# Patient Record
Sex: Female | Born: 1970
Health system: Southern US, Community
[De-identification: ages and names within clinical notes are randomized; demographics above are authoritative.]

## PROBLEM LIST (undated history)

## (undated) DIAGNOSIS — M797 Fibromyalgia: Secondary | ICD-10-CM

## (undated) DIAGNOSIS — C4491 Basal cell carcinoma of skin, unspecified: Secondary | ICD-10-CM

## (undated) DIAGNOSIS — E039 Hypothyroidism, unspecified: Secondary | ICD-10-CM

## (undated) HISTORY — DX: Hypothyroidism, unspecified: E03.9

## (undated) HISTORY — PX: BACK SURGERY: SHX140

## (undated) HISTORY — DX: Fibromyalgia: M79.7

---
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
## 1997-12-28 ENCOUNTER — Other Ambulatory Visit: Admission: RE | Admit: 1997-12-28 | Discharge: 1997-12-28 | Payer: Self-pay | Admitting: Obstetrics and Gynecology

## 2000-01-18 ENCOUNTER — Other Ambulatory Visit: Admission: RE | Admit: 2000-01-18 | Discharge: 2000-01-18 | Payer: Self-pay | Admitting: Obstetrics and Gynecology

## 2001-01-30 ENCOUNTER — Other Ambulatory Visit: Admission: RE | Admit: 2001-01-30 | Discharge: 2001-01-30 | Payer: Self-pay | Admitting: Obstetrics and Gynecology

## 2001-06-03 ENCOUNTER — Other Ambulatory Visit: Admission: RE | Admit: 2001-06-03 | Discharge: 2001-06-03 | Payer: Self-pay | Admitting: Obstetrics and Gynecology

## 2001-12-11 ENCOUNTER — Other Ambulatory Visit: Admission: RE | Admit: 2001-12-11 | Discharge: 2001-12-11 | Payer: Self-pay | Admitting: Obstetrics and Gynecology

## 2002-07-08 ENCOUNTER — Other Ambulatory Visit: Admission: RE | Admit: 2002-07-08 | Discharge: 2002-07-08 | Payer: Self-pay | Admitting: Obstetrics and Gynecology

## 2002-12-24 ENCOUNTER — Other Ambulatory Visit: Admission: RE | Admit: 2002-12-24 | Discharge: 2002-12-24 | Payer: Self-pay | Admitting: Obstetrics and Gynecology

## 2003-04-17 ENCOUNTER — Other Ambulatory Visit: Admission: RE | Admit: 2003-04-17 | Discharge: 2003-04-17 | Payer: Self-pay | Admitting: Obstetrics and Gynecology

## 2010-04-17 ENCOUNTER — Emergency Department (HOSPITAL_COMMUNITY)
Admission: EM | Admit: 2010-04-17 | Discharge: 2010-04-17 | Payer: Self-pay | Source: Home / Self Care | Admitting: Emergency Medicine

## 2010-06-29 ENCOUNTER — Encounter (HOSPITAL_COMMUNITY)
Admission: RE | Admit: 2010-06-29 | Discharge: 2010-06-29 | Disposition: A | Discharge status: Home / Self Care | Payer: BC Managed Care – PPO | Source: Ambulatory Visit | Attending: Obstetrics and Gynecology | Admitting: Obstetrics and Gynecology

## 2010-06-29 LAB — CBC
Hemoglobin: 13.6 g/dL (ref 12.0–15.0)
MCHC: 33.1 g/dL (ref 30.0–36.0)
MCV: 91.9 fL (ref 78.0–100.0)
RBC: 4.47 MIL/uL (ref 3.87–5.11)
RDW: 12.4 % (ref 11.5–15.5)
WBC: 5.3 10*3/uL (ref 4.0–10.5)

## 2010-06-29 LAB — SURGICAL PCR SCREEN
MRSA, PCR: NEGATIVE
Staphylococcus aureus: POSITIVE — AB

## 2010-07-01 ENCOUNTER — Ambulatory Visit (HOSPITAL_COMMUNITY)
Admission: RE | Admit: 2010-07-01 | Discharge: 2010-07-01 | Disposition: A | Discharge status: Home / Self Care | Payer: BC Managed Care – PPO | Source: Ambulatory Visit | Attending: Obstetrics and Gynecology | Admitting: Obstetrics and Gynecology

## 2010-07-01 ENCOUNTER — Other Ambulatory Visit: Payer: Self-pay | Admitting: Obstetrics and Gynecology

## 2010-07-01 DIAGNOSIS — N946 Dysmenorrhea, unspecified: Secondary | ICD-10-CM | POA: Insufficient documentation

## 2010-07-01 DIAGNOSIS — N83209 Unspecified ovarian cyst, unspecified side: Secondary | ICD-10-CM | POA: Insufficient documentation

## 2010-07-01 DIAGNOSIS — N803 Endometriosis of pelvic peritoneum, unspecified: Secondary | ICD-10-CM | POA: Insufficient documentation

## 2010-07-01 DIAGNOSIS — N7013 Chronic salpingitis and oophoritis: Secondary | ICD-10-CM | POA: Insufficient documentation

## 2010-07-01 DIAGNOSIS — IMO0002 Reserved for concepts with insufficient information to code with codable children: Secondary | ICD-10-CM | POA: Insufficient documentation

## 2010-07-01 DIAGNOSIS — R1031 Right lower quadrant pain: Secondary | ICD-10-CM | POA: Insufficient documentation

## 2010-07-01 DIAGNOSIS — Z01818 Encounter for other preprocedural examination: Secondary | ICD-10-CM | POA: Insufficient documentation

## 2010-07-02 NOTE — H&P (Signed)
  Samantha Hart, TAPLEY                 ACCOUNT NO.:  [id number]  MEDICAL RECORD NO.:  [id number]           PATIENT TYPE:  LOCATION:                                 FACILITY:  PHYSICIAN:  Lenoard Aden, M.D.DATE OF BIRTH:  March 30, 1971  DATE OF ADMISSION: DATE OF DISCHARGE:                             HISTORY & PHYSICAL   CHIEF COMPLAINT:  Left lower quadrant pain and dysmenorrhea.  HISTORY OF PRESENT ILLNESS:  She is a 40 year old white female G 0, P0 with a history of ongoing 3-4 months history of worsening pelvic pain and persistent complex left ovarian mass for surgical intervention.  She has signs and symptoms suggestive of pelvic endometriosis with dysmenorrhea and dyspareunia.  She has a most recent event CA-125 of 28.3.  She reports regular periods.  She denies intermenstrual bleeding. She proceeds now for surgical intervention, possible ablation of endometriosis, ovarian cystectomy and chromopertubation.  ALLERGIES:  She has allergies to SULFA.  She has no known latex allergies.  MEDICATIONS:  Synthroid, ibuprofen as needed, Allegra and a multivitamin.  PAST SURGICAL HISTORY:  She has a history of back surgery in 1995 and a tonsillectomy.  Her pregnancy history is noncontributory.  FAMILY HISTORY:  Noncontributory.  PAST MEDICAL HISTORY:  Medical problems include hypothyroidism.  PHYSICAL EXAMINATION:  GENERAL:  She is a well-developed, well-nourished white female in no acute distress.  Weight of 141 pounds, height of 5 feet 2-1/2 half. HEENT:  Normal. NECK:  Supple.  Full range of motion. LUNGS:  Clear. ABDOMEN:  Soft, scaphoid, and nontender. PELVIC:  Reveals uterus to be irregularly shaped, consistent with small fibroids and anteflexed, left adnexal tenderness and fullness appreciated.  Right adnexa was normal.  Rectal exam and cul-de-sac are negative. EXTREMITIES:  No cords. NEUROLOGICAL:  Nonfocal. SKIN:  Intact.  IMPRESSION: 1. Multiple asymptomatic  fibroids. 2. Dysmenorrhea, dyspareunia suggestive of endometriosis. 3. Complex left ovarian cyst suggestive of hemorrhagic cyst versus     endometrioma.  PLAN:  To proceed with da Vinci assisted robotic ablation of endometriosis, left ovarian cystectomy, chromopertubation.  The risks of anesthesia, infection, bleeding, injury to abdominal organs and need for repair was discussed, delayed versus immediate complications to include bowel and bladder injury noted, inability to cure pelvic pain, and/or cure endometriosis discussed.  The patient acknowledges and wishes to proceed.     Lenoard Aden, M.D.     RJT/MEDQ  D:  06/30/2010  T:  07/01/2010  Job:  [id number]  Electronically Signed by Olivia Mackie M.D. on 07/02/2010 11:19:46 AM

## 2010-07-02 NOTE — Op Note (Signed)
Samantha Hart, Samantha Hart                 ACCOUNT NO.:  [id number]  MEDICAL RECORD NO.:  [id number]           PATIENT TYPE:  O  LOCATION:  [facility]                          FACILITY:  [facility]  PHYSICIAN:  Lenoard Aden, M.D.DATE OF BIRTH:  1971/03/15  DATE OF PROCEDURE: DATE OF DISCHARGE:                              OPERATIVE REPORT   PREOPERATIVE DIAGNOSES: 1. Persistent painful left ovarian cyst. 2. Dysmenorrhea.  POSTOPERATIVE DIAGNOSES: 1. Persistent painful left ovarian cyst. 2. Dysmenorrhea. 3. Severe pelvic endometriosis. 4. Anterior and posterior cul-de-sac endometriosis. 5. Bowel adhesions to the left adnexa. 6. Left endometrioma. 7. Left hydrosalpinx with secondary tubal obstruction.  PROCEDURES:  Da Vinci-assisted laparoscopic lysis of adhesions, ablation of endometriosis, excision of endometriosis, left ovarian cystectomy, and chromopertubation.  SURGEON:  Lenoard Aden, MD  ASSISTANT:  Darryl Nestle, MD  ANESTHESIA:  General.  ESTIMATED BLOOD LOSS:  Less than 50 mL.  COMPLICATIONS:  None.  DRAINS:  Foley.  COUNTS:  Correct.  Patient to recovery in good condition.  SPECIMEN:  Left tube, left ovarian cyst wall, and peritoneal endometriosis excision specimen.  DESCRIPTION OF PROCEDURE:  After being apprised of the risks of anesthesia, infection, bleeding, injury to abdominal organs, need for repair, delayed versus immediate complications to include bowel and bladder injury with possible need for repair, the patient brought to the operating room where she was administered a general anesthetic without complications, prepped and draped in usual sterile fashion.  Foley catheter placed.  After achieving adequate anesthesia, dilute Marcaine solution placed in an infraumbilical area.  The cone cannula was placed per vagina and Foley catheter was placed.  Infraumbilical incision was made with scalpel.  Veress needle placed.  Four liters of CO2 insufflated  without difficulty after opening pressure of -2 was noted. 10-mm trocars was placed without difficulty.  Camera was placed. Visualization revealed atraumatic trocar entry.  Normal appendiceal area.  Normal liver and gallbladder area.  There are extensive adhesions to the left sigmoid colon into and obscuring the left adnexa.  There are multiple small subserosal fibroids on the fundal and posterior wall of the uterus.  There is a normal-appearing right tube.  There is a normal- appearing right ovary with evidence of ovarian endometriosis on the right pelvic and pelvic endometriosis in the anterior/posterior cul-de- sac noted.  At this time, accessory trocar sites are made on the left and 2 on the right, 1 assistant port and 1 robotic port.  The robot is then docked after establishing Trendelenburg position without difficulty.  The EndoShears and PK device are entered through the robotic ports under direct visualization and the robot had been docked as previously noted.  At this time, attention was turned to the robotic portion of the procedure where with appropriate retraction and visualization, it was apparent that the left sigmoid mesentery was adherent to the left ovary.  The left tube was also involved in this complex and no fimbria was noted, and there was no evidence of normal tube which was also dilated in its proximal and midportion.  At this point, the retroperitoneal space was entered at the level of  the left round ligament and dissection along the peritoneal wall was used to release the bowel adhesions; and on the lateral wall, these bowel adhesions in the mesentery were then excised sharply from the surface of the ovary avoiding the bowel lumen as noted.  At this time, the left ovarian cyst was opened and a large amount of chocolaty material consistent with left ovarian endometrioma was noted.  The cyst wall was excised in its entirety after identifying the ureter on the left,  and then the left ovary and ovarian adhesions into the left ovarian fossa are excised, freeing the left ovary in its entirety, cyst wall having being removed.  Tunneling under the left tube reveals no evidence of normalcy within that tube and a left hydrosalpinx.  Therefore, progressive bites were taken using the PK device along the left mesosalpinx and the left tube was excised down at the level of the left cornua.  At this time, good hemostasis noted.  The left ovary was now freed from its adhesions and the anterior cul-de-sac was approached whereby peritoneum and anterior cul-de-sac was excised sharply containing endometriosis.  Endometriosis on the right ovary was cauterized without difficulty and also peritoneal endometriosis excised from the posterior cul-de-sac.  At this time, chromopertubation was performed and dye extends freely through the right tube, which appears normal.  Interceed was placed along the entire left adnexa and the left ovarian fossa for adhesion prevention.  Irrigation was accomplished. Good hemostasis was noted.  No further endometriosis was visualized within the pelvis.  At this time, the robotic instruments were removed. The robot was then undocked.  Irrigation was further accomplished and good hemostasis was assured.  All trocar sites were then removed under direct visualization after CO2 was released.  Incisions were closed using 0 Vicryl, 4-0 Vicryl, and Dermabond.  Instruments were removed from the vagina.  Foley catheter was removed.  The patient tolerated the procedure well, was awakened and transferred to recovery room in good condition.     Lenoard Aden, M.D.     RJT/MEDQ  D:  07/01/2010  T:  07/01/2010  Job:  [id number]  Electronically Signed by Olivia Mackie M.D. on 07/02/2010 11:20:12 AM

## 2010-07-11 LAB — COMPREHENSIVE METABOLIC PANEL
AST: 22 U/L (ref 0–37)
Albumin: 4 g/dL (ref 3.5–5.2)
BUN: 13 mg/dL (ref 6–23)
Chloride: 102 mEq/L (ref 96–112)
GFR calc Af Amer: >60 mL/min (ref >60)
GFR calc non Af Amer: >60 mL/min (ref >60)
Total Bilirubin: 0.6 mg/dL (ref 0.3–1.2)

## 2010-07-11 LAB — CBC
HCT: 42.3 % (ref 36.0–46.0)
MCH: 31.3 pg (ref 26.0–34.0)
MCHC: 34.5 g/dL (ref 30.0–36.0)
MCV: 90.8 fL (ref 78.0–100.0)
RBC: 4.66 MIL/uL (ref 3.87–5.11)

## 2010-07-11 LAB — URINALYSIS, ROUTINE W REFLEX MICROSCOPIC
Glucose, UA: NEGATIVE mg/dL
Nitrite: NEGATIVE
Urobilinogen, UA: 0.2 mg/dL (ref 0.0–1.0)

## 2010-07-11 LAB — GC/CHLAMYDIA PROBE AMP, GENITAL: Chlamydia, DNA Probe: NEGATIVE

## 2010-07-11 LAB — WET PREP, GENITAL: Trich, Wet Prep: NONE SEEN

## 2010-07-11 LAB — DIFFERENTIAL
Eosinophils Absolute: 0.1 10*3/uL (ref 0.0–0.7)
Eosinophils Relative: 1 % (ref 0–5)
Lymphs Abs: 1.6 10*3/uL (ref 0.7–4.0)
Monocytes Absolute: 0.7 10*3/uL (ref 0.1–1.0)

## 2010-07-11 LAB — LIPASE, BLOOD: Lipase: 27 U/L (ref 11–59)

## 2011-06-22 ENCOUNTER — Other Ambulatory Visit: Payer: Self-pay | Admitting: Obstetrics and Gynecology

## 2011-06-22 DIAGNOSIS — Z1231 Encounter for screening mammogram for malignant neoplasm of breast: Secondary | ICD-10-CM

## 2011-07-03 ENCOUNTER — Ambulatory Visit
Admission: RE | Admit: 2011-07-03 | Discharge: 2011-07-03 | Disposition: A | Discharge status: Home / Self Care | Payer: BC Managed Care – PPO | Source: Ambulatory Visit | Attending: Obstetrics and Gynecology | Admitting: Obstetrics and Gynecology

## 2011-07-03 DIAGNOSIS — Z1231 Encounter for screening mammogram for malignant neoplasm of breast: Secondary | ICD-10-CM

## 2012-06-28 ENCOUNTER — Other Ambulatory Visit: Payer: Self-pay | Admitting: Obstetrics and Gynecology

## 2012-06-28 ENCOUNTER — Other Ambulatory Visit: Payer: Self-pay

## 2012-06-28 DIAGNOSIS — Z1231 Encounter for screening mammogram for malignant neoplasm of breast: Secondary | ICD-10-CM

## 2012-07-26 ENCOUNTER — Ambulatory Visit
Admission: RE | Admit: 2012-07-26 | Discharge: 2012-07-26 | Disposition: A | Discharge status: Home / Self Care | Payer: PRIVATE HEALTH INSURANCE | Source: Ambulatory Visit

## 2012-07-26 DIAGNOSIS — Z1231 Encounter for screening mammogram for malignant neoplasm of breast: Secondary | ICD-10-CM

## 2012-09-25 DIAGNOSIS — C4491 Basal cell carcinoma of skin, unspecified: Secondary | ICD-10-CM

## 2012-09-25 HISTORY — DX: Basal cell carcinoma of skin, unspecified: C44.91

## 2013-08-04 ENCOUNTER — Other Ambulatory Visit: Payer: Self-pay

## 2013-08-04 DIAGNOSIS — Z1231 Encounter for screening mammogram for malignant neoplasm of breast: Secondary | ICD-10-CM

## 2013-08-22 ENCOUNTER — Ambulatory Visit: Payer: PRIVATE HEALTH INSURANCE

## 2013-09-25 ENCOUNTER — Ambulatory Visit: Payer: PRIVATE HEALTH INSURANCE

## 2014-06-19 ENCOUNTER — Ambulatory Visit
Admission: RE | Admit: 2014-06-19 | Discharge: 2014-06-19 | Disposition: A | Discharge status: Home / Self Care | Payer: PRIVATE HEALTH INSURANCE | Source: Ambulatory Visit

## 2014-06-19 DIAGNOSIS — Z1231 Encounter for screening mammogram for malignant neoplasm of breast: Secondary | ICD-10-CM

## 2016-06-09 ENCOUNTER — Other Ambulatory Visit: Payer: Self-pay | Admitting: Obstetrics and Gynecology

## 2016-06-09 DIAGNOSIS — Z1231 Encounter for screening mammogram for malignant neoplasm of breast: Secondary | ICD-10-CM

## 2016-06-21 ENCOUNTER — Ambulatory Visit
Admission: RE | Admit: 2016-06-21 | Discharge: 2016-06-21 | Disposition: A | Discharge status: Home / Self Care | Payer: PRIVATE HEALTH INSURANCE | Source: Ambulatory Visit | Attending: Obstetrics and Gynecology | Admitting: Obstetrics and Gynecology

## 2016-06-21 DIAGNOSIS — Z1231 Encounter for screening mammogram for malignant neoplasm of breast: Secondary | ICD-10-CM

## 2017-06-28 ENCOUNTER — Other Ambulatory Visit: Payer: Self-pay | Admitting: Obstetrics and Gynecology

## 2017-06-28 DIAGNOSIS — Z1231 Encounter for screening mammogram for malignant neoplasm of breast: Secondary | ICD-10-CM

## 2017-07-02 ENCOUNTER — Ambulatory Visit
Admission: RE | Admit: 2017-07-02 | Discharge: 2017-07-02 | Disposition: A | Discharge status: Home / Self Care | Payer: PRIVATE HEALTH INSURANCE | Source: Ambulatory Visit | Attending: Obstetrics and Gynecology | Admitting: Obstetrics and Gynecology

## 2017-07-02 DIAGNOSIS — Z1231 Encounter for screening mammogram for malignant neoplasm of breast: Secondary | ICD-10-CM

## 2018-07-02 ENCOUNTER — Other Ambulatory Visit: Payer: Self-pay | Admitting: Internal Medicine

## 2018-07-02 DIAGNOSIS — Z1231 Encounter for screening mammogram for malignant neoplasm of breast: Secondary | ICD-10-CM

## 2018-07-05 ENCOUNTER — Ambulatory Visit: Payer: PRIVATE HEALTH INSURANCE

## 2018-08-07 ENCOUNTER — Ambulatory Visit: Payer: PRIVATE HEALTH INSURANCE

## 2018-09-12 ENCOUNTER — Other Ambulatory Visit: Payer: Self-pay

## 2018-09-12 ENCOUNTER — Ambulatory Visit
Admission: RE | Admit: 2018-09-12 | Discharge: 2018-09-12 | Disposition: A | Discharge status: Home / Self Care | Payer: Commercial Managed Care - PPO | Source: Ambulatory Visit | Attending: Internal Medicine | Admitting: Internal Medicine

## 2018-09-12 DIAGNOSIS — Z1231 Encounter for screening mammogram for malignant neoplasm of breast: Secondary | ICD-10-CM

## 2018-09-19 ENCOUNTER — Ambulatory Visit: Payer: PRIVATE HEALTH INSURANCE

## 2018-10-11 ENCOUNTER — Other Ambulatory Visit: Payer: Self-pay | Admitting: Rheumatology

## 2018-10-11 DIAGNOSIS — R0989 Other specified symptoms and signs involving the circulatory and respiratory systems: Secondary | ICD-10-CM

## 2018-10-21 ENCOUNTER — Encounter: Payer: Self-pay | Admitting: *Deleted

## 2018-10-24 ENCOUNTER — Ambulatory Visit
Admission: RE | Admit: 2018-10-24 | Discharge: 2018-10-24 | Disposition: A | Discharge status: Home / Self Care | Payer: Commercial Managed Care - PPO | Source: Ambulatory Visit | Attending: Rheumatology | Admitting: Rheumatology

## 2018-10-24 DIAGNOSIS — R0989 Other specified symptoms and signs involving the circulatory and respiratory systems: Secondary | ICD-10-CM

## 2018-11-04 ENCOUNTER — Other Ambulatory Visit: Payer: Self-pay

## 2018-11-04 ENCOUNTER — Ambulatory Visit (INDEPENDENT_AMBULATORY_CARE_PROVIDER_SITE_OTHER): Payer: Commercial Managed Care - PPO | Admitting: Pulmonary Disease

## 2018-11-04 ENCOUNTER — Encounter: Payer: Self-pay | Admitting: Pulmonary Disease

## 2018-11-04 VITALS — BP 112/70 | HR 96 | Temp 98.0°F | Ht 62.0 in | Wt 125.0 lb

## 2018-11-04 DIAGNOSIS — R9389 Abnormal findings on diagnostic imaging of other specified body structures: Secondary | ICD-10-CM

## 2018-11-04 NOTE — Progress Notes (Signed)
t  Subjective:     Patient ID: Samantha Hart, female   DOB: April 28, 1971, 48 y.o.   MRN: [id number]  She is being seen for abnormal CT scan of the chest  CT scan of the chest reveals groundglass changes This was done as a follow-up of her chest x-ray  Patient has no significant respiratory complaints at present Denies any chronic cough Denies any fevers or chills Denies any shortness of breath with activity  No previous history of lung disease known to her  Never smoker  No significant occupational exposure  No family history of lung disease    Review of Systems  Constitutional: Negative.   HENT: Positive for sinus pressure.   Eyes: Negative.   Respiratory: Negative.   Cardiovascular: Negative.   Gastrointestinal: Positive for constipation.  Endocrine: Positive for cold intolerance.  Genitourinary: Negative.   Musculoskeletal: Positive for arthralgias, joint swelling, neck pain and neck stiffness.  Skin: Negative.   Allergic/Immunologic: Positive for environmental allergies.  Neurological: Negative.   Hematological: Bruises/bleeds easily.  Psychiatric/Behavioral: Positive for dysphoric mood. The patient is nervous/anxious.    Past Medical History:  Diagnosis Date  . BCC (basal cell carcinoma) 09/25/2012   left lower lip- ([facility])  . Fibromyalgia   . Hypothyroidism    Family History  Problem Relation Age of Onset  . Lung cancer Father    Social History   Socioeconomic History  . Marital status: Married    Spouse name: Not on file  . Number of children: Not on file  . Years of education: Not on file  . Highest education level: Not on file  Occupational History  . Not on file  Social Needs  . Financial resource strain: Not on file  . Food insecurity    Worry: Not on file    Inability: Not on file  . Transportation needs    Medical: Not on file    Non-medical: Not on file  Tobacco Use  . Smoking status: Never Smoker  . Smokeless tobacco: Never Used   Substance and Sexual Activity  . Alcohol use: Not Currently  . Drug use: Not Currently  . Sexual activity: Not on file  Lifestyle  . Physical activity    Days per week: Not on file    Minutes per session: Not on file  . Stress: Not on file  Relationships  . Social Herbalist on phone: Not on file    Gets together: Not on file    Attends religious service: Not on file    Active member of club or organization: Not on file    Attends meetings of clubs or organizations: Not on file    Relationship status: Not on file  . Intimate partner violence    Fear of current or ex partner: Not on file    Emotionally abused: Not on file    Physically abused: Not on file    Forced sexual activity: Not on file  Other Topics Concern  . Not on file  Social History Narrative  . Not on file       Objective:   Physical Exam Constitutional:      General: She is not in acute distress.    Appearance: Normal appearance.  HENT:     Head: Normocephalic and atraumatic.  Eyes:     General:        Right eye: No discharge.        Left eye: No discharge.  Extraocular Movements: Extraocular movements intact.     Pupils: Pupils are equal, round, and reactive to light.  Neck:     Musculoskeletal: Normal range of motion. No neck rigidity or muscular tenderness.  Cardiovascular:     Rate and Rhythm: Normal rate and regular rhythm.     Pulses: Normal pulses.     Heart sounds: No murmur.  Pulmonary:     Effort: Pulmonary effort is normal. No respiratory distress.     Breath sounds: Normal breath sounds. No stridor. No wheezing, rhonchi or rales.  Chest:     Chest wall: No tenderness.  Abdominal:     General: Abdomen is flat. There is no distension.     Palpations: There is no mass.  Musculoskeletal: Normal range of motion.        General: No swelling or tenderness.  Skin:    General: Skin is warm and dry.     Coloration: Skin is not jaundiced or pale.  Neurological:     General:  No focal deficit present.     Mental Status: She is alert.  Psychiatric:        Mood and Affect: Mood normal.    Vitals:   11/04/18 1136  BP: 112/70  Pulse: 96  Temp: 98 F (36.7 C)  SpO2: 98%    High-resolution CT scan of the chest reviewed with the patient Areas of groundglass changes noted mostly on the right base No evidence of scarring     Assessment:     Abnormal CT scan of the chest showing groundglass changes -She is completely asymptomatic at the present time with no cough, no shortness of breath, -Recent treatment for URI-completing course of Augmentin -No previous history of lung disease -Never smoker Significance of changes is unclear at the present time May be related to connective tissue disease however MCTD is not a usual cause of groundglass changes on current CT does not reveal any significant evidence of scarring  Mixed connective tissue disease  Fibromyalgia    Plan:     Will obtain a pulmonary function study  The PFT and high-resolution CT can be seen as baseline studies  Patient worked to develop any significant symptoms of shortness of breath, cough, respiratory complaints Studies may be repeated to try and explain symptoms  No contraindication to use of methotrexate Repeat PFTs/high-resolution CTs without symptoms have not been shown to help in monitoring for development of symptoms  I will see her back in the office in about 3 months Encouraged to call with any significant concerns

## 2018-11-04 NOTE — Patient Instructions (Signed)
Abnormal CT scan of the chest with groundglass changes CT is not suggestive of interstitial lung disease  Patient currently is completely asymptomatic Recent URI  We will obtain pulmonary function test  The PFT and high-resolution CT can be seen as baseline studies If she were to develop any symptoms they can always be repeated and compared to further explain symptoms No contraindication to using methotrexate   I will see you back in the office in 3 months Call with any concerns

## 2019-02-01 ENCOUNTER — Other Ambulatory Visit (HOSPITAL_COMMUNITY)
Admission: RE | Admit: 2019-02-01 | Discharge: 2019-02-01 | Disposition: A | Discharge status: Home / Self Care | Payer: Commercial Managed Care - PPO | Source: Ambulatory Visit | Attending: Pulmonary Disease | Admitting: Pulmonary Disease

## 2019-02-01 DIAGNOSIS — Z01812 Encounter for preprocedural laboratory examination: Secondary | ICD-10-CM | POA: Insufficient documentation

## 2019-02-01 DIAGNOSIS — Z20828 Contact with and (suspected) exposure to other viral communicable diseases: Secondary | ICD-10-CM | POA: Diagnosis not present

## 2019-02-03 ENCOUNTER — Ambulatory Visit: Payer: Commercial Managed Care - PPO | Admitting: Pulmonary Disease

## 2019-02-03 LAB — NOVEL CORONAVIRUS, NAA (HOSP ORDER, SEND-OUT TO REF LAB; TAT 18-24 HRS): SARS-CoV-2, NAA: NOT DETECTED

## 2019-02-05 ENCOUNTER — Ambulatory Visit (INDEPENDENT_AMBULATORY_CARE_PROVIDER_SITE_OTHER): Payer: Commercial Managed Care - PPO | Admitting: Pulmonary Disease

## 2019-02-05 ENCOUNTER — Encounter: Payer: Self-pay | Admitting: Pulmonary Disease

## 2019-02-05 ENCOUNTER — Other Ambulatory Visit: Payer: Self-pay

## 2019-02-05 VITALS — BP 114/62 | HR 88 | Ht 63.0 in | Wt 126.0 lb

## 2019-02-05 DIAGNOSIS — R9389 Abnormal findings on diagnostic imaging of other specified body structures: Secondary | ICD-10-CM

## 2019-02-05 LAB — PULMONARY FUNCTION TEST
DL/VA % pred: 124 %
DL/VA: 5.44 ml/min/mmHg/L
DLCO unc % pred: 107 %
DLCO unc: 22.02 ml/min/mmHg
FEF 25-75 Post: 2.37 L/sec
FEF 25-75 Pre: 1.94 L/sec
FEF2575-%Change-Post: 22 %
FEF2575-%Pred-Post: 85 %
FEF2575-%Pred-Pre: 69 %
FEV1-%Change-Post: 11 %
FEV1-%Pred-Post: 83 %
FEV1-%Pred-Pre: 75 %
FEV1-Post: 2.32 L
FEV1-Pre: 2.08 L
FEV1FVC-%Change-Post: 5 %
FEV1FVC-%Pred-Pre: 98 %
FEV6-%Change-Post: 6 %
FEV6-%Pred-Post: 81 %
FEV6-%Pred-Pre: 76 %
FEV6-Post: 2.75 L
FEV6-Pre: 2.58 L
FEV6FVC-%Change-Post: 0 %
FEV6FVC-%Pred-Post: 102 %
FEV6FVC-%Pred-Pre: 102 %
FVC-%Change-Post: 5 %
FVC-%Pred-Post: 80 %
FVC-%Pred-Pre: 75 %
FVC-Post: 2.77 L
FVC-Pre: 2.61 L
Post FEV1/FVC ratio: 84 %
Post FEV6/FVC ratio: 100 %
Pre FEV1/FVC ratio: 80 %
Pre FEV6/FVC Ratio: 100 %
RV % pred: 100 %
RV: 1.71 L
TLC % pred: 87 %
TLC: 4.28 L

## 2019-02-05 MED ORDER — BREO ELLIPTA 100-25 MCG/INH IN AEPB: 1.0000 | INHALATION_SPRAY | Freq: Every day | RESPIRATORY_TRACT | 0 refills | Status: AC

## 2019-02-05 MED ORDER — BREO ELLIPTA 100-25 MCG/INH IN AEPB: 1.0000 | INHALATION_SPRAY | Freq: Every day | RESPIRATORY_TRACT | 5 refills | Status: AC

## 2019-02-05 MED ORDER — ALBUTEROL SULFATE HFA 108 (90 BASE) MCG/ACT IN AERS: 2.0000 | INHALATION_SPRAY | Freq: Four times a day (QID) | RESPIRATORY_TRACT | 6 refills | Status: AC | PRN

## 2019-02-05 NOTE — Progress Notes (Signed)
Subjective:     Patient ID: Samantha Hart, female   DOB: October 16, 1970, 48 y.o.   MRN: [id number]  She is being seen for abnormal CT scan of the chest No significant changes in symptoms since being here She does get some shortness of breath with activity  CT scan of the chest reveals groundglass changes This was done as a follow-up of her chest x-ray She was being treated for respiratory infection/sinus infection with Augmentin  No significant symptoms suggesting respiratory infection at present No chronic cough, no fevers or chills Some shortness of breath with significant activity  No previous history of lung disease known to her  Never smoker  No significant occupational exposure  No family history of lung disease    Review of Systems  Constitutional: Negative.   Eyes: Negative.   Respiratory: Negative.   Cardiovascular: Negative.   Gastrointestinal: Positive for constipation.  Endocrine: Positive for cold intolerance.  Genitourinary: Negative.   Musculoskeletal: Negative.   Skin: Negative.   Allergic/Immunologic: Positive for environmental allergies.  Neurological: Negative.   Hematological: Bruises/bleeds easily.  Psychiatric/Behavioral: Positive for dysphoric mood.  All other systems reviewed and are negative.  Past Medical History:  Diagnosis Date  . BCC (basal cell carcinoma) 09/25/2012   left lower lip- ([facility])  . Fibromyalgia   . Hypothyroidism    Family History  Problem Relation Age of Onset  . Lung cancer Father    Social History   Socioeconomic History  . Marital status: Married    Spouse name: Not on file  . Number of children: Not on file  . Years of education: Not on file  . Highest education level: Not on file  Occupational History  . Not on file  Social Needs  . Financial resource strain: Not on file  . Food insecurity    Worry: Not on file    Inability: Not on file  . Transportation needs    Medical: Not on file    Non-medical: Not  on file  Tobacco Use  . Smoking status: Never Smoker  . Smokeless tobacco: Never Used  Substance and Sexual Activity  . Alcohol use: Not Currently  . Drug use: Not Currently  . Sexual activity: Not on file  Lifestyle  . Physical activity    Days per week: Not on file    Minutes per session: Not on file  . Stress: Not on file  Relationships  . Social Herbalist on phone: Not on file    Gets together: Not on file    Attends religious service: Not on file    Active member of club or organization: Not on file    Attends meetings of clubs or organizations: Not on file    Relationship status: Not on file  . Intimate partner violence    Fear of current or ex partner: Not on file    Emotionally abused: Not on file    Physically abused: Not on file    Forced sexual activity: Not on file  Other Topics Concern  . Not on file  Social History Narrative  . Not on file       Objective:   Physical Exam Constitutional:      General: She is not in acute distress.    Appearance: Normal appearance.  HENT:     Head: Normocephalic and atraumatic.     Nose: No congestion.  Eyes:     General:        Right  eye: No discharge.        Left eye: No discharge.     Extraocular Movements: Extraocular movements intact.     Pupils: Pupils are equal, round, and reactive to light.  Neck:     Musculoskeletal: Normal range of motion. No neck rigidity or muscular tenderness.  Cardiovascular:     Rate and Rhythm: Normal rate and regular rhythm.     Pulses: Normal pulses.     Heart sounds: No murmur.  Pulmonary:     Effort: Pulmonary effort is normal. No respiratory distress.     Breath sounds: Normal breath sounds. No stridor. No wheezing, rhonchi or rales.     Comments: Pectus excavatum Chest:     Chest wall: No tenderness.  Abdominal:     General: Abdomen is flat. There is no distension.     Palpations: There is no mass.  Skin:    General: Skin is dry.  Neurological:     Mental  Status: She is alert.  Psychiatric:        Mood and Affect: Mood normal.    Vitals:   02/05/19 1008  BP: 114/62  Pulse: 88  SpO2: 98%    High-resolution CT scan of the chest reviewed with the patient-review today Areas of groundglass changes noted mostly on the right base No evidence of scarring  PFT shows no obstruction, evidence of small airway obstructive obstruction with bronchodilator response of 22% No restriction Normal diffusing capacity    Assessment:     Abnormal CT scan of the chest showing groundglass changes -This may have been related to recent URI -It needs followed up to resolution -She has no previous underlying history of lung disease -Never smoker -No occupational predisposition -PFT shows no restrictive physiology  Shortness of breath with significant exertion -There may be a bronchospastic component component/small airway disease -Trial with an inhaler may be of benefit -This was discussed extensively how to use inhalers  Methotrexate may cause interstitial lung disease -We will follow closely  Fibromyalgia     Plan:     Will start Breo 100 to be used once a day  Albuterol to be used as needed  Repeat CT scan of the chest without contrast for follow-up of infiltrative process to resolution  No contraindication to use of methotrexate  We will repeat CT scan of the chest  I will see her back in the office in about 3 months  Encouraged to call with any significant concerns

## 2019-02-05 NOTE — Progress Notes (Signed)
Full PFT performed today. °

## 2019-02-05 NOTE — Patient Instructions (Signed)
Abnormal CT scan showing groundglass changes Breathing study today is normal with some improvement in lung function with use of albuterol  We will prescribe albuterol to be used as needed up to 4 times a day Breo-to be used once a day regardless of how you are feeling We will repeat your CT scan to compare with previous-expectation is for the haziness to have cleared completely by now  I will see you back in the office in 3 months Call with significant concerns

## 2019-05-08 ENCOUNTER — Inpatient Hospital Stay: Admission: RE | Admit: 2019-05-08 | Payer: Commercial Managed Care - PPO | Source: Ambulatory Visit

## 2019-05-09 ENCOUNTER — Ambulatory Visit (INDEPENDENT_AMBULATORY_CARE_PROVIDER_SITE_OTHER)
Admission: RE | Admit: 2019-05-09 | Discharge: 2019-05-09 | Disposition: A | Discharge status: Home / Self Care | Payer: Commercial Managed Care - PPO | Source: Ambulatory Visit | Attending: Pulmonary Disease | Admitting: Pulmonary Disease

## 2019-05-09 ENCOUNTER — Other Ambulatory Visit: Payer: Self-pay

## 2019-05-09 DIAGNOSIS — R9389 Abnormal findings on diagnostic imaging of other specified body structures: Secondary | ICD-10-CM

## 2019-05-22 ENCOUNTER — Ambulatory Visit (INDEPENDENT_AMBULATORY_CARE_PROVIDER_SITE_OTHER): Payer: Commercial Managed Care - PPO | Admitting: Pulmonary Disease

## 2019-05-22 ENCOUNTER — Encounter: Payer: Self-pay | Admitting: Pulmonary Disease

## 2019-05-22 ENCOUNTER — Other Ambulatory Visit: Payer: Self-pay

## 2019-05-22 VITALS — BP 116/68 | HR 64 | Temp 98.4°F | Ht 62.0 in | Wt 129.8 lb

## 2019-05-22 DIAGNOSIS — R9389 Abnormal findings on diagnostic imaging of other specified body structures: Secondary | ICD-10-CM

## 2019-05-22 NOTE — Patient Instructions (Signed)
Your CAT scan cleared up completely as we reviewed  You may continue to use albuterol as needed  We will be glad to see you if any issues arise  Otherwise continue to follow-up with your primary doctor

## 2019-05-22 NOTE — Progress Notes (Signed)
Subjective:     Patient ID: Samantha Hart, female   DOB: [birth year], 49 y.o.   MRN: [id number]  She is in for follow-up today  She is feeling well Symptoms have significantly improved Occasionally still feels she needs to use albuterol whenever she is working out Denies a cough No shortness of breath with usual activity  Initially seen for abnormal CT with bibasal groundglass infiltrates She was treated with Augmentin at the time  No significant symptoms suggesting respiratory infection at present No chronic cough, no fevers or chills Some shortness of breath with significant activity  No previous history of lung disease known to her  Never smoker  No significant occupational exposure  No family history of lung disease    Review of Systems  Constitutional: Negative.   Eyes: Negative.   Respiratory: Negative.   Cardiovascular: Negative.   Gastrointestinal: Positive for constipation.  Endocrine: Positive for cold intolerance.  Genitourinary: Negative.   Musculoskeletal: Negative.   Skin: Negative.   Allergic/Immunologic: Positive for environmental allergies.  Neurological: Negative.   All other systems reviewed and are negative.  Past Medical History:  Diagnosis Date  . BCC (basal cell carcinoma) 09/25/2012   left lower lip- (MOHS)  . Fibromyalgia   . Hypothyroidism    Family History  Problem Relation Age of Onset  . Lung cancer Father    Social History   Socioeconomic History  . Marital status: Married    Spouse name: Not on file  . Number of children: Not on file  . Years of education: Not on file  . Highest education level: Not on file  Occupational History  . Not on file  Tobacco Use  . Smoking status: Never Smoker  . Smokeless tobacco: Never Used  Substance and Sexual Activity  . Alcohol use: Not Currently  . Drug use: Not Currently  . Sexual activity: Not on file  Other Topics Concern  . Not on file  Social History Narrative  . Not on file    Social Determinants of Health   Financial Resource Strain:   . Difficulty of Paying Living Expenses: Not on file  Food Insecurity:   . Worried About Charity fundraiser in the Last Year: Not on file  . Ran Out of Food in the Last Year: Not on file  Transportation Needs:   . Lack of Transportation (Medical): Not on file  . Lack of Transportation (Non-Medical): Not on file  Physical Activity:   . Days of Exercise per Week: Not on file  . Minutes of Exercise per Session: Not on file  Stress:   . Feeling of Stress : Not on file  Social Connections:   . Frequency of Communication with Friends and Family: Not on file  . Frequency of Social Gatherings with Friends and Family: Not on file  . Attends Religious Services: Not on file  . Active Member of Clubs or Organizations: Not on file  . Attends Archivist Meetings: Not on file  . Marital Status: Not on file  Intimate Partner Violence:   . Fear of Current or Ex-Partner: Not on file  . Emotionally Abused: Not on file  . Physically Abused: Not on file  . Sexually Abused: Not on file       Objective:   Physical Exam Constitutional:      General: She is not in acute distress.    Appearance: Normal appearance.  HENT:     Head: Normocephalic and atraumatic.  Nose: No congestion.  Eyes:     General:        Right eye: No discharge.        Left eye: No discharge.     Extraocular Movements: Extraocular movements intact.     Pupils: Pupils are equal, round, and reactive to light.  Cardiovascular:     Rate and Rhythm: Normal rate and regular rhythm.     Pulses: Normal pulses.     Heart sounds: No murmur.  Pulmonary:     Effort: Pulmonary effort is normal. No respiratory distress.     Breath sounds: Normal breath sounds. No stridor. No wheezing, rhonchi or rales.     Comments: Pectus excavatum Chest:     Chest wall: No tenderness.  Musculoskeletal:     Cervical back: Normal range of motion. No rigidity. No muscular  tenderness.  Neurological:     Mental Status: She is alert.    Vitals:   05/22/19 1145  BP: 116/68  Pulse: 64  Temp: 98.4 F (36.9 C)  SpO2: 97%    High-resolution CT scan of the chest reviewed with the patient-review today -CT reviewed showing resolution of previous infiltrative process  PFT with within normal limits, slight improvement with bronchodilators in the small airways    Assessment:     Abnormal CT scan of the chest showing groundglass changes -CT is completely clear at present -No significant symptoms at rest -No cough -Occupational predisposition  Chest tightness, shortness of breath with activity -She may continue to use albuterol as needed  Methotrexate may cause interstitial lung disease -We will follow closely  Fibromyalgia     Plan:     Will discontinue Breo  Albuterol use as needed  No need for repeat CT or PFT  She may follow-up with primary doctor  We will see as needed

## 2019-08-05 ENCOUNTER — Other Ambulatory Visit: Payer: Self-pay | Admitting: Internal Medicine

## 2019-08-05 DIAGNOSIS — Z1231 Encounter for screening mammogram for malignant neoplasm of breast: Secondary | ICD-10-CM

## 2019-09-15 ENCOUNTER — Ambulatory Visit: Payer: Commercial Managed Care - PPO

## 2020-09-29 ENCOUNTER — Other Ambulatory Visit: Payer: Self-pay | Admitting: Internal Medicine

## 2020-09-29 DIAGNOSIS — Z1231 Encounter for screening mammogram for malignant neoplasm of breast: Secondary | ICD-10-CM

## 2021-03-13 LAB — EXTERNAL GENERIC LAB PROCEDURE: COLOGUARD: NEGATIVE

## 2021-03-13 LAB — COLOGUARD: COLOGUARD: NEGATIVE

## 2021-07-29 IMAGING — CT CT CHEST W/O CM
2 of 3 series · 15 of 36 positions shown, 18 images · non-contrast
Comparison: High-resolution chest CT 10/24/2018.

CLINICAL DATA: 48-year-old female with mild bilateral lower lobe
scattered ground-glass pulmonary opacity on high-resolution chest CT
September 2018.

EXAM:
CT CHEST WITHOUT CONTRAST
TECHNIQUE: Multidetector CT imaging of the chest was performed following the
standard protocol without IV contrast.

[Series 2: thorax · axial · 0.63mm/px · z∈[-202,+42]mm · 12 of 144 slices shown, 15 images]
[im 11/144  mediastinal]
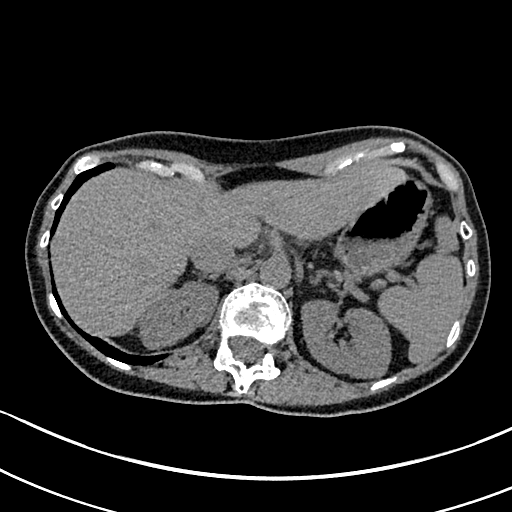
[im 11/144  lung]
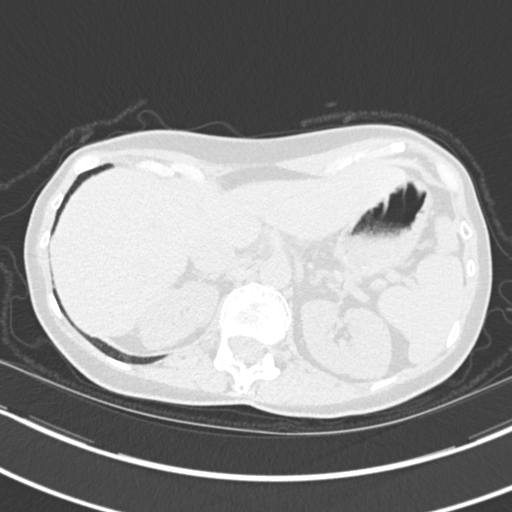
[im 22/144  lung]
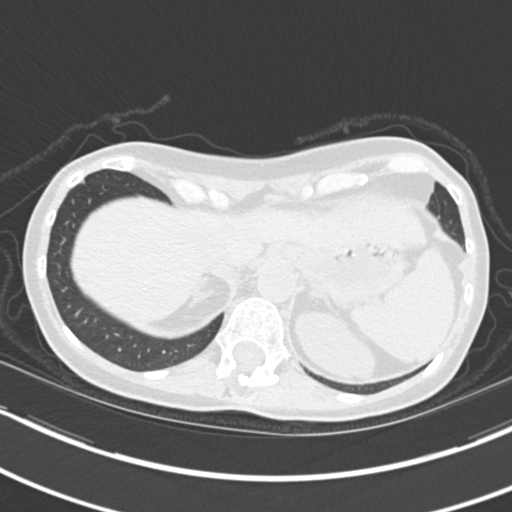
[im 32/144  lung]
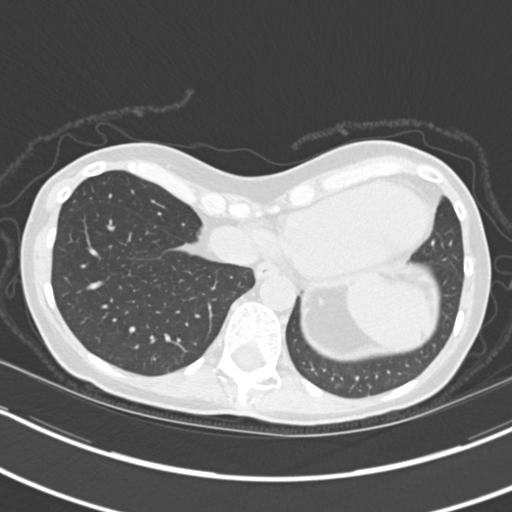
[im 43/144  lung]
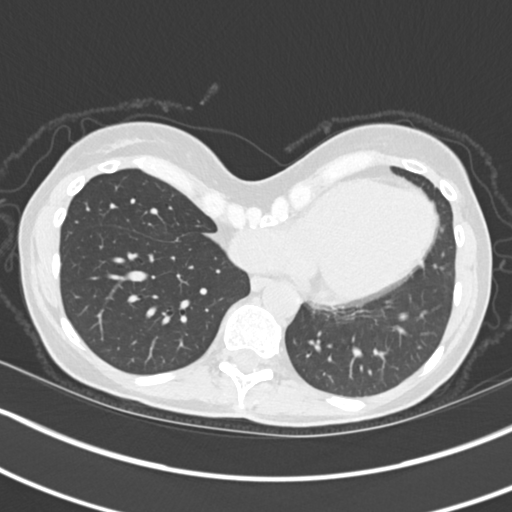
[im 53/144  mediastinal]
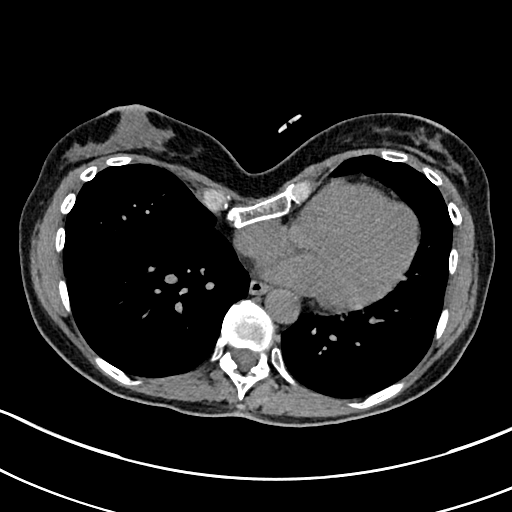
[im 53/144  lung]
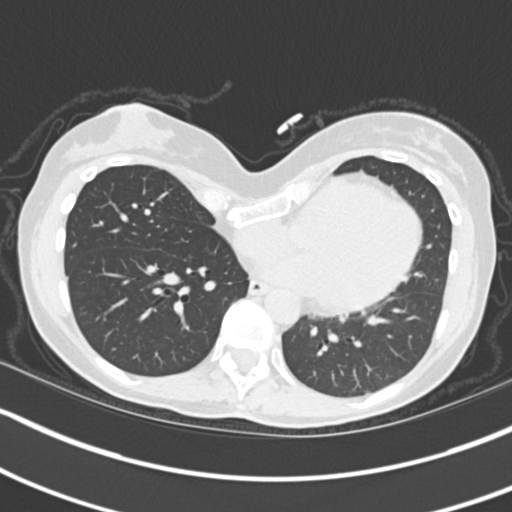
[im 64/144  lung]
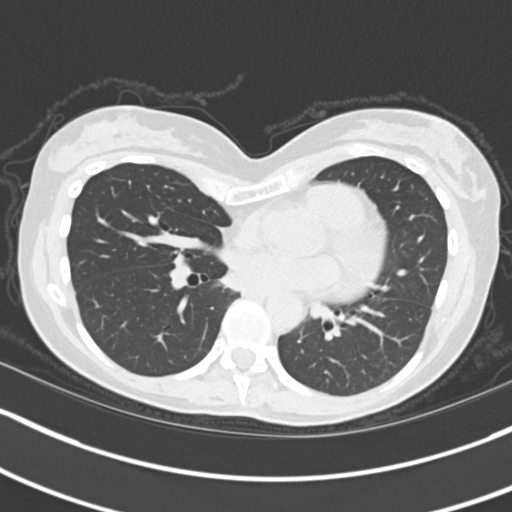
[im 80/144  lung]
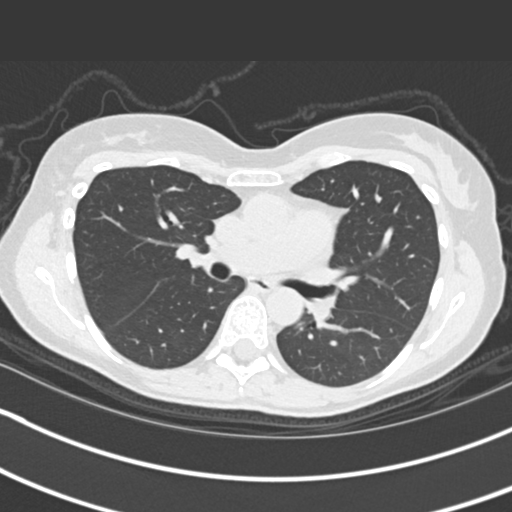
[im 91/144  lung]
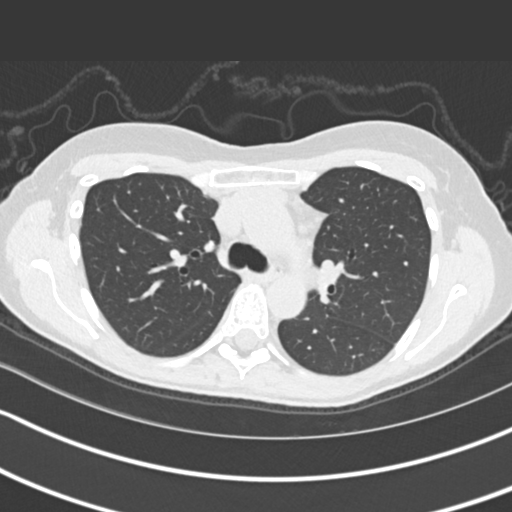
[im 101/144  mediastinal]
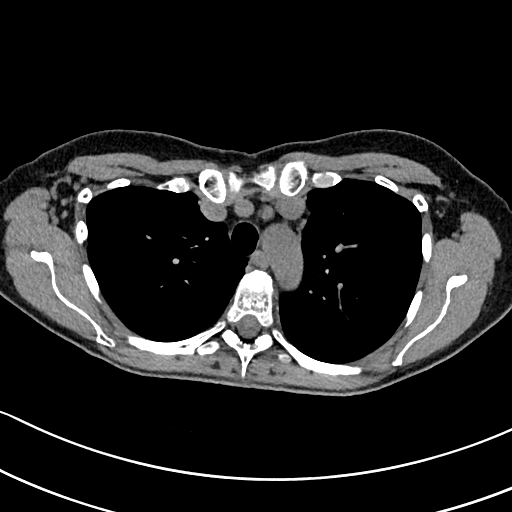
[im 101/144  lung]
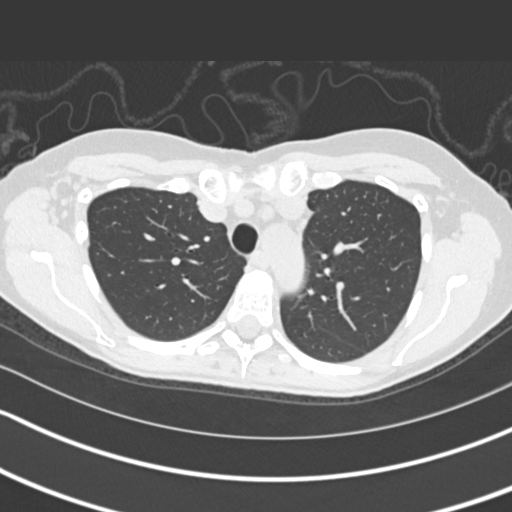
[im 112/144  lung]
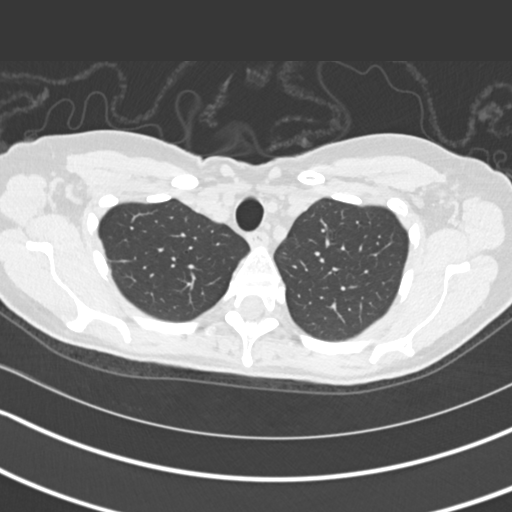
[im 122/144  lung]
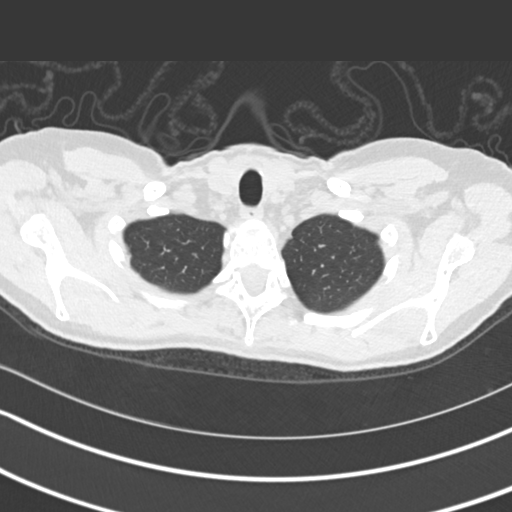
[im 133/144  lung]
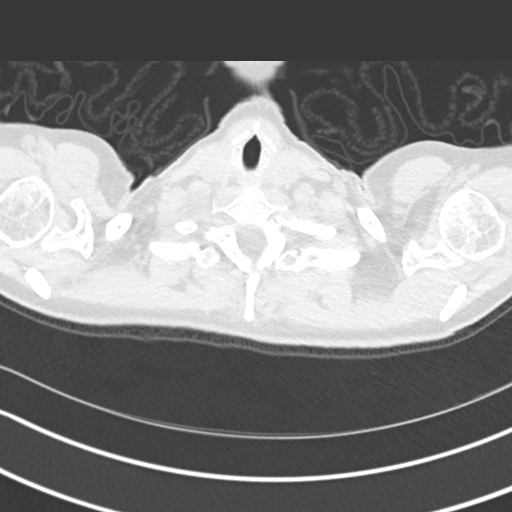

[Series 5: coronal · coronal · 0.59mm/px · 3 of 99 slices shown]
[im 20/99  lung]
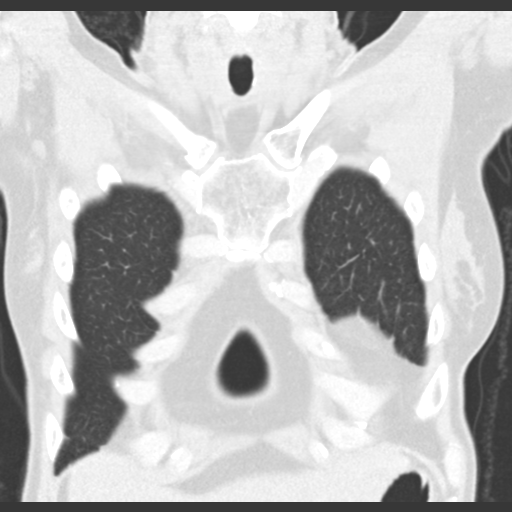
[im 40/99  lung]
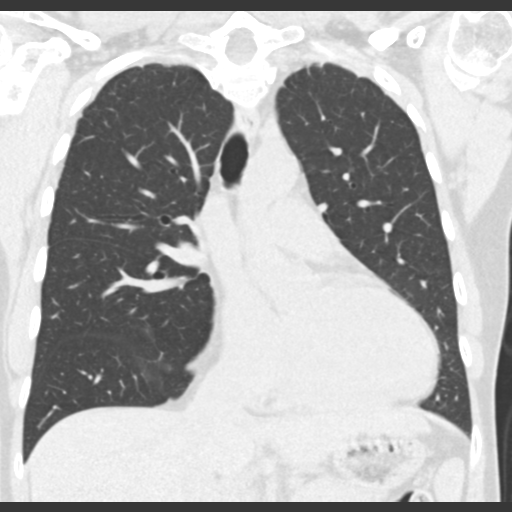
[im 59/99  lung]
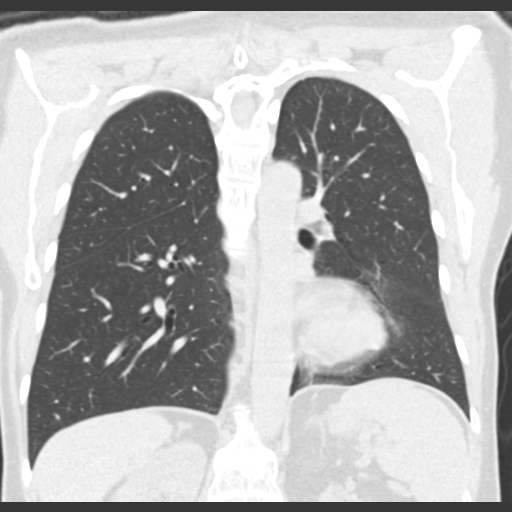

[15 of 36 positions shown; findings below may reference images not displayed]

FINDINGS: Cardiovascular: No cardiomegaly or pericardial effusion. No
calcified coronary artery atherosclerosis is evident. Vascular
patency is not evaluated in the absence of IV contrast.

Mediastinum/Nodes: Negative.  No lymphadenopathy.

Lungs/Pleura: Stable lung volumes.  Major airways are patent.

The bilateral central and superior segment lower lobe faint
peribronchial ground-glass opacity seen in [REDACTED] has fully resolved.
There is minimal anterior basal segment left lower lobe scarring or
atelectasis. Both lungs are otherwise clear.

Upper Abdomen: Negative visible noncontrast liver, spleen, pancreas,
adrenal glands, kidneys and bowel in the upper abdomen.

Musculoskeletal: Pectus excavatum and mild dextroconvex thoracic
scoliosis. Posterior element ankylosis at the thoracolumbar
junction.

Superimposed mild chronic vertebral compression fractures including
T1 through T5 and L1. Several superimposed thoracic superior
endplate Schmorl's nodes. No acute osseous abnormality identified.
IMPRESSION: 1. Negative for pulmonary nodule, and the faint areas of bilateral
lower lobe ground-glass opacity in September 2018 have resolved.
2. No acute findings in the chest.
3. Pectus excavatum. Chronic vertebral compression fractures. Mild
scoliosis and thoracolumbar junction posterior element ankylosis.

## 2021-10-08 ENCOUNTER — Emergency Department (HOSPITAL_COMMUNITY)
Admission: EM | Admit: 2021-10-08 | Discharge: 2021-10-09 | Disposition: A | Discharge status: Home / Self Care | Payer: Commercial Managed Care - PPO | Attending: Emergency Medicine | Admitting: Emergency Medicine

## 2021-10-08 ENCOUNTER — Encounter (HOSPITAL_COMMUNITY): Payer: Self-pay

## 2021-10-08 ENCOUNTER — Other Ambulatory Visit: Payer: Self-pay

## 2021-10-08 ENCOUNTER — Emergency Department (HOSPITAL_COMMUNITY): Payer: Commercial Managed Care - PPO

## 2021-10-08 DIAGNOSIS — M545 Low back pain, unspecified: Secondary | ICD-10-CM | POA: Diagnosis not present

## 2021-10-08 DIAGNOSIS — E039 Hypothyroidism, unspecified: Secondary | ICD-10-CM | POA: Diagnosis not present

## 2021-10-08 MED ORDER — FENTANYL CITRATE PF 50 MCG/ML IJ SOSY
50.0000 ug | PREFILLED_SYRINGE | Freq: Once | INTRAMUSCULAR | Status: AC
  Administered 2021-10-08: 50 ug via INTRAVENOUS
  Filled 2021-10-08: qty 1

## 2021-10-08 MED ORDER — ONDANSETRON HCL 4 MG/2ML IJ SOLN
4.0000 mg | Freq: Once | INTRAMUSCULAR | Status: AC | PRN
  Administered 2021-10-08: 4 mg via INTRAVENOUS
  Filled 2021-10-08: qty 2

## 2021-10-08 NOTE — ED Notes (Signed)
Patient had one episode of emesis. Triage RN notified.

## 2021-10-08 NOTE — ED Provider Triage Note (Signed)
Emergency Medicine Provider Triage Evaluation Note  Samantha DROMGOOLE , a 51 y.o. female  was evaluated in triage.  Pt complains of back pain status post fall while standing on her yard.  Endorsing midline tenderness exacerbated along the right side especially with raising the right leg.  Given 50 mics of fentanyl by EMS.  Prior history of back surgery by Dr Ellene Route.  No numbness or tingling to bilateral legs.  Review of Systems  Positive: Back pain Negative: Minnis, incontinence,fever   Physical Exam  BP 113/71 (BP Location: Right Arm)   Pulse 83   Temp 98.6 F (37 C) (Oral)   Resp 16   LMP 06/27/2011   SpO2 100%  Gen:   Awake, no distress   Resp:  Normal effort  MSK:   Moves extremities without difficulty  Other:  To palpation along the midline of the lumbar spine, radiating onto the right buttocks.  Decreased strength to bilateral extremities due to pain.   Medical Decision Making  Medically screening exam initiated at 7:25 PM.  Appropriate orders placed.  VARIE MACHAMER was informed that the remainder of the evaluation will be completed by another provider, this initial triage assessment does not replace that evaluation, and the importance of remaining in the ED until their evaluation is complete.  With prior history of pathology, felt appropriate to obtain baseline CTs.  Given 50 mics of fentanyl due to discomfort.   Janeece Fitting, PA-C 10/08/21 1929

## 2021-10-08 NOTE — ED Triage Notes (Signed)
Pt arrives EMS after a mechanical fall in the yard. C/o lower back pain worst right of spine. Difficulty raising legs. Denies numbness or tingling.   Peripheral IV in left ac. Fentanyl pta.

## 2021-10-09 MED ORDER — LIDOCAINE 5 % EX PTCH: 1.0000 | MEDICATED_PATCH | CUTANEOUS | 0 refills | Status: AC

## 2021-10-09 MED ORDER — OXYCODONE HCL 5 MG PO TABS: 5.0000 mg | ORAL_TABLET | ORAL | 0 refills | Status: AC | PRN

## 2021-10-09 MED ORDER — METHOCARBAMOL 500 MG PO TABS: 500.0000 mg | ORAL_TABLET | Freq: Three times a day (TID) | ORAL | 0 refills | Status: AC | PRN

## 2021-10-09 NOTE — ED Provider Notes (Signed)
[facility] Emergency Department Provider Note MRN:  [id number]  Arrival date & time: 10/09/21     Chief Complaint   Back Pain   History of Present Illness   Samantha Hart is a 51 y.o. year-old female with no pertinent past medical history presenting to the ED with chief complaint of back pain.  Patient was doing yard work/landscaping on a slope and was backing up and fell backwards down the slope.  Landed on her buttocks/lower back.  Having pain since that time.  No numbness or weakness to the arms or legs, no bowel or bladder dysfunction, no other complaints.  Needed back surgery 30 years ago.  Review of Systems  A thorough review of systems was obtained and all systems are negative except as noted in the HPI and PMH.   Patient's Health History    Past Medical History:  Diagnosis Date   BCC (basal cell carcinoma) 09/25/2012   left lower lip- ([facility]HS)   Fibromyalgia    Hypothyroidism     Past Surgical History:  Procedure Laterality Date   BACK SURGERY      Family History  Problem Relation Age of Onset   Lung cancer Father     Social History   Socioeconomic History   Marital status: Married    Spouse name: Not on file   Number of children: Not on file   Years of education: Not on file   Highest education level: Not on file  Occupational History   Not on file  Tobacco Use   Smoking status: Never   Smokeless tobacco: Never  Substance and Sexual Activity   Alcohol use: Not Currently   Drug use: Not Currently   Sexual activity: Not on file  Other Topics Concern   Not on file  Social History Narrative   Not on file   Social Determinants of Health   Financial Resource Strain: Not on file  Food Insecurity: Not on file  Transportation Needs: Not on file  Physical Activity: Not on file  Stress: Not on file  Social Connections: Not on file  Intimate Partner Violence: Not on file     Physical Exam   Vitals:   10/09/21 0228 10/09/21  0516  BP: 124/68 108/72  Pulse: 80 74  Resp: 16 16  Temp: 98.6 F (37 C) 99 F (37.2 C)  SpO2: 97% 95%    CONSTITUTIONAL: Well-appearing, NAD NEURO/PSYCH:  Alert and oriented x 3, normal and symmetric strength and sensation to the lower extremities EYES:  eyes equal and reactive ENT/NECK:  no LAD, no JVD CARDIO: Regular rate, well-perfused, normal S1 and S2 PULM:  CTAB no wheezing or rhonchi GI/GU:  non-distended, non-tender MSK/SPINE:  No gross deformities, no edema SKIN:  no rash, atraumatic   *Additional and/or pertinent findings included in MDM below  Diagnostic and Interventional Summary    EKG Interpretation  Date/Time:    Ventricular Rate:    PR Interval:    QRS Duration:   QT Interval:    QTC Calculation:   R Axis:     Text Interpretation:         Labs Reviewed - No data to display  CT Lumbar Spine Wo Contrast  Final Result      Medications  fentaNYL (SUBLIMAZE) injection 50 mcg (50 mcg Intravenous Given 10/08/21 1957)  ondansetron (ZOFRAN) injection 4 mg (4 mg Intravenous Given 10/08/21 2154)     Procedures  /  Critical Care Procedures  ED Course  and Medical Decision Making  Initial Impression and Ddx Differential diagnosis includes muscle bruising, strain, spinal fracture.  No red flag symptoms to suggest myelopathy.  Vitals normal, no other injuries, no head trauma.  CT imaging reassuring, appropriate for discharge.  Past medical/surgical history that increases complexity of ED encounter: Remote history of lumbar surgery  Interpretation of Diagnostics I personally reviewed the CT lumbar and my interpretation is as follows: No obvious fracture    Patient Reassessment and Ultimate Disposition/Management     Discharge home  Patient management required discussion with the following services or consulting groups:  None  Complexity of Problems Addressed Acute illness or injury that poses threat of life of bodily function  Additional Data  Reviewed and Analyzed Further history obtained from: Further history from spouse/family member  Additional Factors Impacting ED Encounter Risk Prescriptions  Barth Kirks. Sedonia Small, [place] [email or web address]  Final Clinical Impressions(s) / ED Diagnoses     ICD-10-CM   1. Acute midline low back pain without sciatica  M54.50       ED Discharge Orders          Ordered    oxyCODONE (ROXICODONE) 5 MG immediate release tablet  Every 4 hours PRN        10/09/21 0559    methocarbamol (ROBAXIN) 500 MG tablet  Every 8 hours PRN        10/09/21 0559    lidocaine (LIDODERM) 5 %  Every 24 hours        10/09/21 0559             Discharge Instructions Discussed with and Provided to Patient:    Discharge Instructions      You were evaluated in the Emergency Department and after careful evaluation, we did not find any emergent condition requiring admission or further testing in the hospital.  Your exam/testing today is overall reassuring.  CT scan did not show any broken bones or emergencies.  Recommend Tylenol 1000 mg every 4-6 hours and/or Motrin 600 mg every 4-6 hours for pain.  You can use the Robaxin muscle relaxer or oxycodone medication for more significant pain.  Best used at night if you are having trouble sleeping.  Do not mix the Robaxin and the oxycodone.  Can also use the numbing patches during the day for pain relief.  Please return to the Emergency Department if you experience any worsening of your condition.   Thank you for allowing [place] to be a part of your care.      Maudie Flakes, MD 10/09/21 [phone]

## 2021-10-09 NOTE — Discharge Instructions (Signed)
You were evaluated in the Emergency Department and after careful evaluation, we did not find any emergent condition requiring admission or further testing in the hospital.  Your exam/testing today is overall reassuring.  CT scan did not show any broken bones or emergencies.  Recommend Tylenol 1000 mg every 4-6 hours and/or Motrin 600 mg every 4-6 hours for pain.  You can use the Robaxin muscle relaxer or oxycodone medication for more significant pain.  Best used at night if you are having trouble sleeping.  Do not mix the Robaxin and the oxycodone.  Can also use the numbing patches during the day for pain relief.  Please return to the Emergency Department if you experience any worsening of your condition.   Thank you for allowing [place] to be a part of your care.

## 2022-06-14 ENCOUNTER — Other Ambulatory Visit: Payer: Self-pay | Admitting: Obstetrics and Gynecology

## 2022-06-14 DIAGNOSIS — Z1231 Encounter for screening mammogram for malignant neoplasm of breast: Secondary | ICD-10-CM

## 2022-08-03 ENCOUNTER — Ambulatory Visit
Admission: RE | Admit: 2022-08-03 | Discharge: 2022-08-03 | Disposition: A | Discharge status: Home / Self Care | Payer: Commercial Managed Care - PPO | Source: Ambulatory Visit | Attending: Obstetrics and Gynecology | Admitting: Obstetrics and Gynecology

## 2022-08-03 DIAGNOSIS — Z1231 Encounter for screening mammogram for malignant neoplasm of breast: Secondary | ICD-10-CM

## 2023-02-07 ENCOUNTER — Ambulatory Visit (INDEPENDENT_AMBULATORY_CARE_PROVIDER_SITE_OTHER): Payer: Commercial Managed Care - PPO | Admitting: Otolaryngology

## 2023-02-28 ENCOUNTER — Encounter (INDEPENDENT_AMBULATORY_CARE_PROVIDER_SITE_OTHER): Payer: Self-pay

## 2023-02-28 ENCOUNTER — Ambulatory Visit (INDEPENDENT_AMBULATORY_CARE_PROVIDER_SITE_OTHER): Payer: Commercial Managed Care - PPO | Admitting: Otolaryngology

## 2023-02-28 VITALS — Ht 62.0 in | Wt 137.0 lb

## 2023-02-28 DIAGNOSIS — R42 Dizziness and giddiness: Secondary | ICD-10-CM

## 2023-03-03 DIAGNOSIS — R42 Dizziness and giddiness: Secondary | ICD-10-CM | POA: Insufficient documentation

## 2023-03-03 NOTE — Progress Notes (Signed)
Patient ID: Samantha Hart, female   DOB: 12-Jun-1970, 52 y.o.   MRN: [id number]  CC: Recurrent dizziness  HPI:  Samantha Hart is a 52 y.o. female who presents today complaining of recurrent dizziness.  She has been symptomatic for 1 month.  She describes her dizziness as a spinning vertigo that lasts from seconds to minutes.  In between episodes, she also has some difficulty with her balance.  Her vertigo is often triggered when she leans forward or backward.  She denies any otalgia, otorrhea, tinnitus, or hearing loss.  She has no previous ENT surgery.  She also denies any recent otitis media, otitis externa, or upper respiratory infections.  Past Medical History:  Diagnosis Date   BCC (basal cell carcinoma) 09/25/2012   left lower lip- ([facility]HS)   Fibromyalgia    Hypothyroidism     Past Surgical History:  Procedure Laterality Date   BACK SURGERY      Family History  Problem Relation Age of Onset   Lung cancer Father     Social History:  reports that she has never smoked. She has never used smokeless tobacco. She reports that she does not currently use alcohol. She reports that she does not currently use drugs.  Allergies:  Allergies  Allergen Reactions   Sulfacetamide Sodium Other (See Comments)   Sulfa Antibiotics Rash    Prior to Admission medications   Medication Sig Start Date End Date Taking? Authorizing Provider  albuterol (VENTOLIN HFA) 108 (90 Base) MCG/ACT inhaler Inhale 2 puffs into the lungs every 6 (six) hours as needed for wheezing or shortness of breath. 02/05/19  Yes Olalere, Adewale A, MD  clonazePAM (KLONOPIN) 0.5 MG tablet Take 0.5 mg by mouth 2 (two) times daily as needed for anxiety.   Yes [provider]  fluticasone furoate-vilanterol (BREO ELLIPTA) 100-25 MCG/INH AEPB Inhale 1 puff into the lungs daily. 02/05/19  Yes Olalere, Adewale A, MD  fluticasone furoate-vilanterol (BREO ELLIPTA) 100-25 MCG/INH AEPB Inhale 1 puff into the lungs daily. 02/05/19  Yes  Olalere, Adewale A, MD  hydroxychloroquine (PLAQUENIL) 200 MG tablet TAKE 2 TABLETS BY MOUTH WITH FOOD OR MILK DAILY 06/27/15  Yes [provider]  levothyroxine (SYNTHROID) 100 MCG tablet Take by mouth. 06/26/15  Yes [provider]  lidocaine (LIDODERM) 5 % Place 1 patch onto the skin daily. Remove & Discard patch within 12 hours or as directed by MD 10/09/21  Yes Sabas Sous, MD  methocarbamol (ROBAXIN) 500 MG tablet Take 1 tablet (500 mg total) by mouth every 8 (eight) hours as needed for muscle spasms. 10/09/21  Yes Sabas Sous, MD  methotrexate 2.5 MG tablet Take 2.5 mg by mouth once a week. 6 per week   Yes [provider]  naltrexone (DEPADE) 50 MG tablet Take by mouth daily. 4mg  once daily   Yes [provider]  oxyCODONE (ROXICODONE) 5 MG immediate release tablet Take 1 tablet (5 mg total) by mouth every 4 (four) hours as needed for severe pain. 10/09/21   Sabas Sous, MD   Height 5\' 2"  (1.575 m), weight 137 lb (62.1 kg), last menstrual period 06/27/2011. Exam: General: Communicates without difficulty, well nourished, no acute distress. Head: Normocephalic, no evidence injury, no tenderness, facial buttresses intact without stepoff. Face/sinus: No tenderness to palpation and percussion. Facial movement is normal and symmetric. Eyes: PERRL, EOMI. No scleral icterus, conjunctivae clear. Neuro: CN II exam reveals vision grossly intact.  No nystagmus at any point of gaze.  Ears: Auricles well formed without lesions.  Ear canals are intact without mass or lesion.  No erythema or edema is appreciated.  The TMs are intact without fluid. Nose: External evaluation reveals normal support and skin without lesions.  Dorsum is intact.  Anterior rhinoscopy reveals congested mucosa over anterior aspect of inferior turbinates and intact septum.  No purulence noted. Oral:  Oral cavity and oropharynx are intact, symmetric, without erythema or edema.  Mucosa is moist  without lesions. Neck: Full range of motion without pain.  There is no significant lymphadenopathy.  No masses palpable.  Thyroid bed within normal limits to palpation.  Parotid glands and submandibular glands equal bilaterally without mass.  Trachea is midline. Neuro:  CN 2-12 grossly intact.  Vestibular: No nystagmus at any point of gaze. Dix Hallpike negative. Vestibular: There is no nystagmus with pneumatic pressure on either tympanic membrane or Valsalva. The cerebellar examination is unremarkable.    Assessment: 1.  The patient's ear canals, tympanic membranes, and middle ear spaces are all normal.  Her Dix-Hallpike maneuver is negative. 2.  The patient's history is suggestive of transient benign paroxysmal positional vertigo.  Other possible differential diagnoses include vestibular migraine, Meniere's disease, peripheral vestibular dysfunction, or other central/systemic causes.   3.  The rest of her ENT exam is normal.  Plan: 1.  The physical exam findings are reviewed with the patient. 2.  The pathophysiology of BPPV and dizziness are discussed extensively with the patient. The possible differential diagnoses are reviewed. Questions are invited and answered.   3.  Since the patient's Dix-Hallpike maneuver is negative today, we will proceed with conservative observation. 4.  The patient will return for reevaluation and treatment if her BPPV recurs.  Aleira Deiter W Alvin Diffee 03/03/2023, 6:31 AM

## 2023-12-11 ENCOUNTER — Other Ambulatory Visit: Payer: Self-pay | Admitting: Rheumatology

## 2023-12-11 DIAGNOSIS — J849 Interstitial pulmonary disease, unspecified: Secondary | ICD-10-CM

## 2023-12-20 ENCOUNTER — Ambulatory Visit
Admission: RE | Admit: 2023-12-20 | Discharge: 2023-12-20 | Disposition: A | Discharge status: Home / Self Care | Source: Ambulatory Visit | Attending: Rheumatology | Admitting: Rheumatology

## 2023-12-20 DIAGNOSIS — J849 Interstitial pulmonary disease, unspecified: Secondary | ICD-10-CM

## 2023-12-25 ENCOUNTER — Telehealth: Payer: Self-pay | Admitting: Internal Medicine

## 2023-12-25 ENCOUNTER — Other Ambulatory Visit (HOSPITAL_COMMUNITY): Payer: Self-pay | Admitting: Registered Nurse

## 2023-12-25 DIAGNOSIS — R110 Nausea: Secondary | ICD-10-CM

## 2023-12-25 DIAGNOSIS — R140 Abdominal distension (gaseous): Secondary | ICD-10-CM

## 2023-12-25 DIAGNOSIS — R1011 Right upper quadrant pain: Secondary | ICD-10-CM

## 2023-12-25 NOTE — Telephone Encounter (Signed)
 LVM for patient for patient to call and discuss the 01/25/24 appointment with Dr. Darlean.  Patient has seen Dr. Neda in the past and needs to see one of the 'Lung Nodule providers

## 2023-12-25 NOTE — Telephone Encounter (Signed)
 PT ret Kay's call.

## 2023-12-26 ENCOUNTER — Other Ambulatory Visit (HOSPITAL_COMMUNITY)

## 2023-12-26 ENCOUNTER — Ambulatory Visit (HOSPITAL_BASED_OUTPATIENT_CLINIC_OR_DEPARTMENT_OTHER)
Admission: RE | Admit: 2023-12-26 | Discharge: 2023-12-26 | Disposition: A | Discharge status: Home / Self Care | Source: Ambulatory Visit | Attending: Registered Nurse | Admitting: Registered Nurse

## 2023-12-26 DIAGNOSIS — R140 Abdominal distension (gaseous): Secondary | ICD-10-CM | POA: Insufficient documentation

## 2023-12-26 DIAGNOSIS — R110 Nausea: Secondary | ICD-10-CM | POA: Diagnosis present

## 2023-12-26 DIAGNOSIS — R1011 Right upper quadrant pain: Secondary | ICD-10-CM | POA: Insufficient documentation

## 2023-12-27 ENCOUNTER — Other Ambulatory Visit (HOSPITAL_BASED_OUTPATIENT_CLINIC_OR_DEPARTMENT_OTHER): Payer: Self-pay | Admitting: Registered Nurse

## 2023-12-27 ENCOUNTER — Ambulatory Visit (HOSPITAL_BASED_OUTPATIENT_CLINIC_OR_DEPARTMENT_OTHER)
Admission: RE | Admit: 2023-12-27 | Discharge: 2023-12-27 | Disposition: A | Discharge status: Home / Self Care | Source: Ambulatory Visit | Attending: Registered Nurse | Admitting: Registered Nurse

## 2023-12-27 DIAGNOSIS — R110 Nausea: Secondary | ICD-10-CM | POA: Diagnosis present

## 2023-12-27 DIAGNOSIS — R1011 Right upper quadrant pain: Secondary | ICD-10-CM | POA: Insufficient documentation

## 2023-12-27 DIAGNOSIS — R140 Abdominal distension (gaseous): Secondary | ICD-10-CM | POA: Insufficient documentation

## 2023-12-27 MED ORDER — IOHEXOL 300 MG/ML  SOLN
100.0000 mL | Freq: Once | INTRAMUSCULAR | Status: AC | PRN
  Administered 2023-12-27: 100 mL via INTRAVENOUS

## 2023-12-29 IMAGING — CT CT L SPINE W/O CM
3 series · 13 of 33 positions shown, 16 images · non-contrast
Comparison: None Available.

CLINICAL DATA: Back pain after fall



[Series 3: l-spine 2.0 st · axial · 0.29mm/px · z∈[+1062,+1222]mm · 5 of 116 slices shown, 7 images]
[im 18/116  soft-tissue]
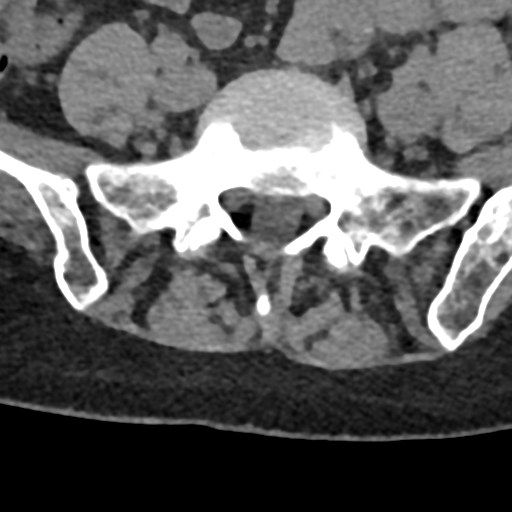
[im 18/116  bone]
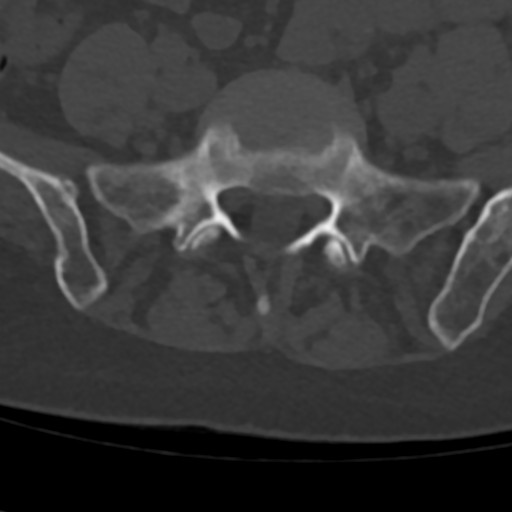
[im 36/116  bone]
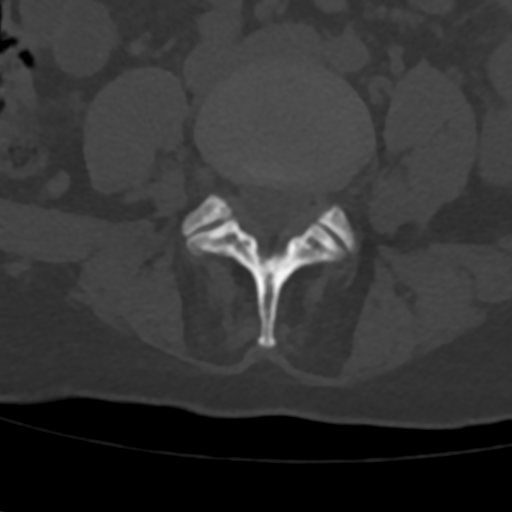
[im 62/116  bone]
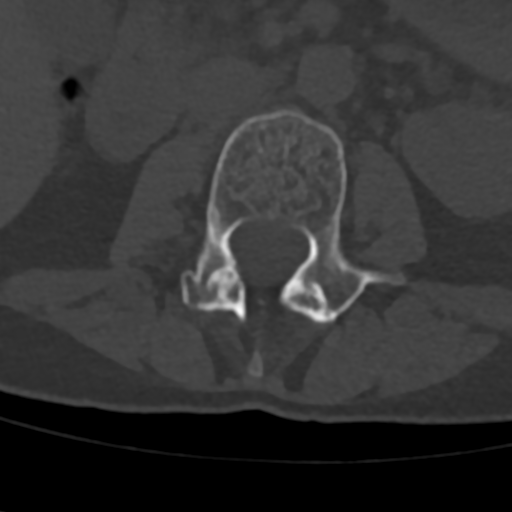
[im 80/116  bone]
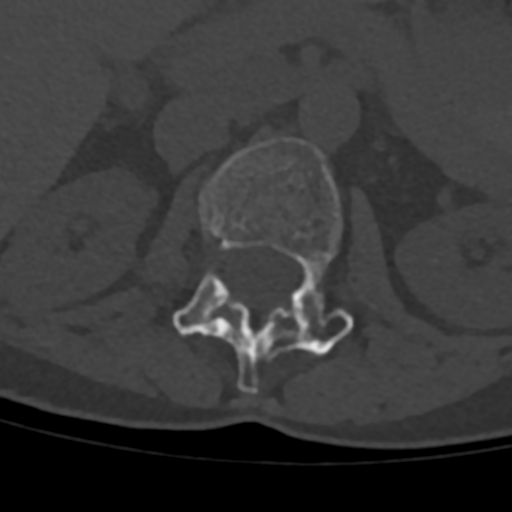
[im 98/116  soft-tissue]
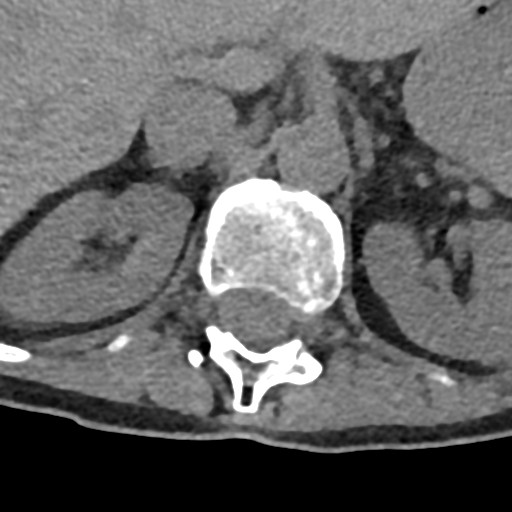
[im 98/116  bone]
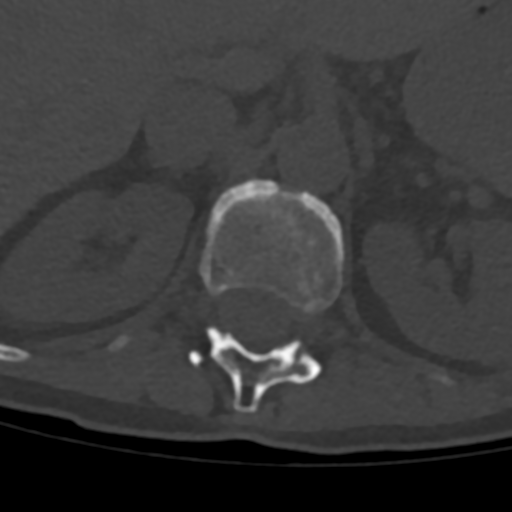

[Series 7: l-spine 2.0 cor · coronal · 0.34mm/px · 3 of 63 slices shown]
[im 13/63  bone]
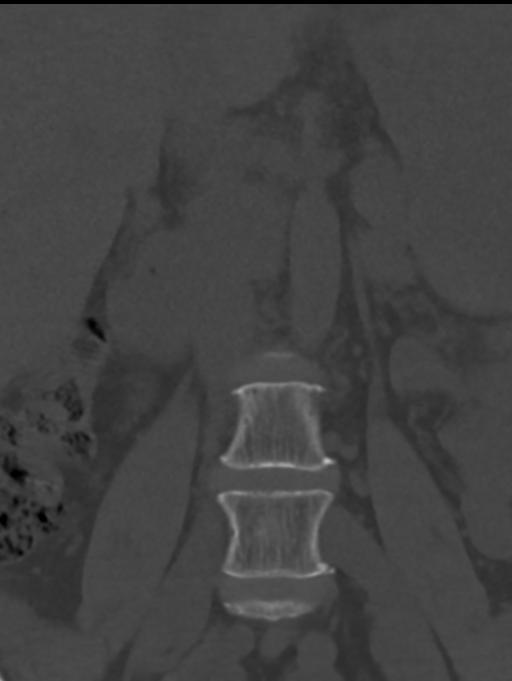
[im 25/63  bone]
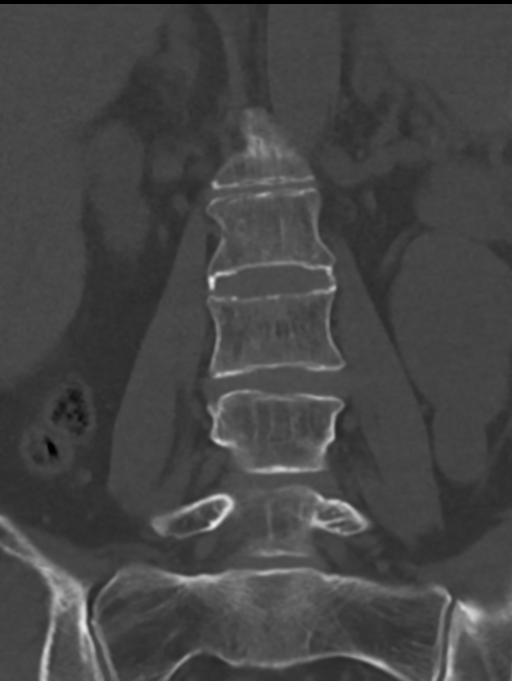
[im 38/63  bone]
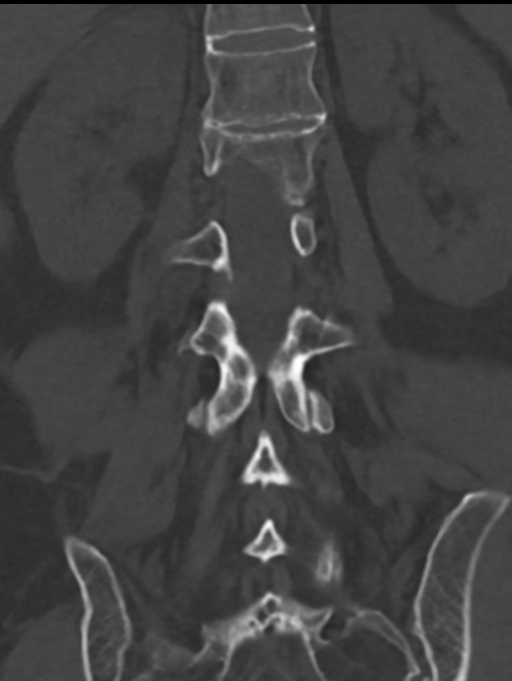

[Series 8: l-spine 2.0 sag · sagittal · 0.34mm/px · 5 of 61 slices shown, 6 images]
[im 21/61  bone]
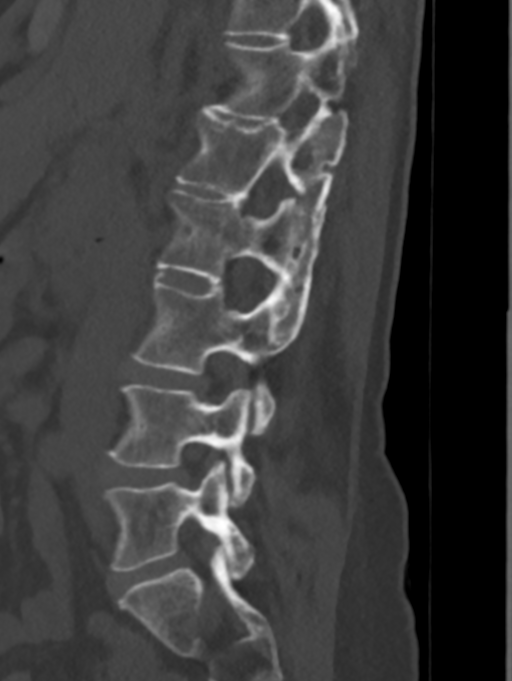
[im 26/61  bone]
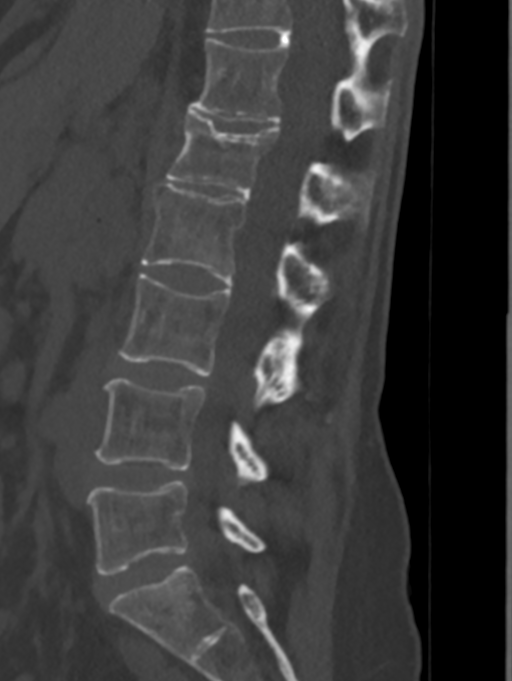
[im 31/61  soft-tissue]
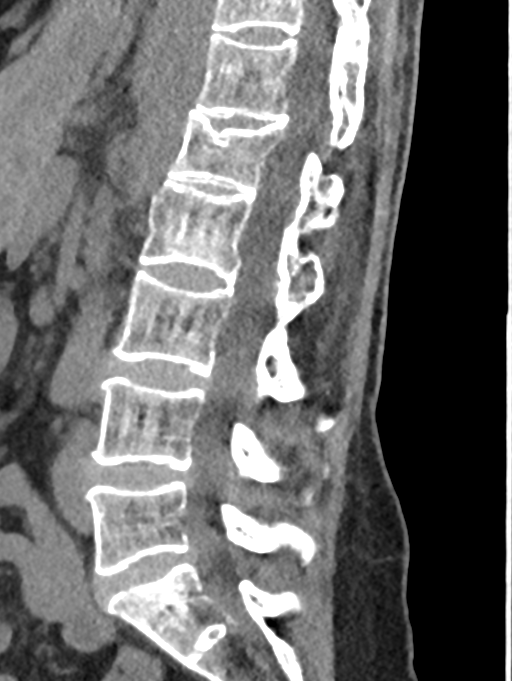
[im 31/61  bone]
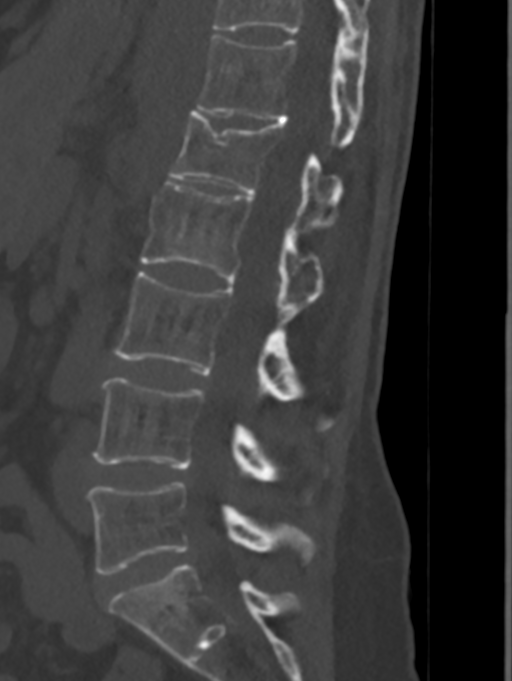
[im 36/61  bone]
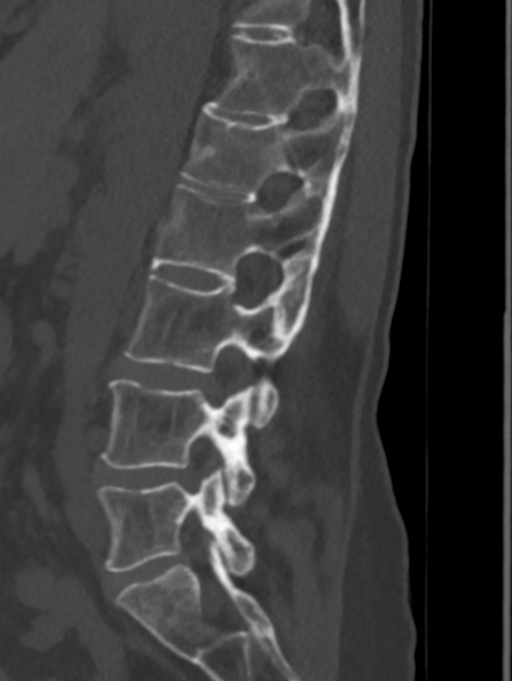
[im 41/61  bone]
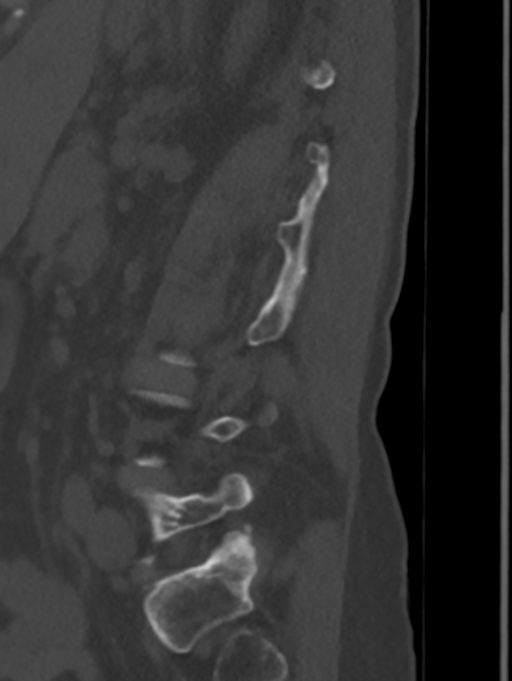

[13 of 33 positions shown; findings below may reference images not displayed]

FINDINGS: Segmentation: 5 lumbar type vertebrae.

Alignment: Normal.

Vertebrae: There is posterior ankylosis of the spine from the top of
the field of view to the L3 level. There is chronic height loss of
L1 with a large superior endplate Schmorl's node. No acute fracture.

Paraspinal and other soft tissues: Negative.

Disc levels: No spinal canal stenosis or neural impingement.
IMPRESSION: 1. No acute fracture or static subluxation of the lumbar spine.
2. Posterior ankylosis of the spine from the top of the field of
view to the L3 level.
3. Chronic height loss of L1 with large superior endplate Schmorl's
node.

## 2024-01-25 ENCOUNTER — Ambulatory Visit: Admitting: Internal Medicine

## 2024-02-19 LAB — COLOGUARD: COLOGUARD: NEGATIVE

## 2024-02-21 ENCOUNTER — Ambulatory Visit: Admitting: Pulmonary Disease

## 2024-02-21 ENCOUNTER — Encounter: Payer: Self-pay | Admitting: Pulmonary Disease

## 2024-02-21 VITALS — BP 113/75 | HR 84 | Temp 97.9°F | Ht 62.0 in | Wt 131.0 lb

## 2024-02-21 DIAGNOSIS — M351 Other overlap syndromes: Secondary | ICD-10-CM

## 2024-02-21 DIAGNOSIS — J8489 Other specified interstitial pulmonary diseases: Secondary | ICD-10-CM

## 2024-02-21 DIAGNOSIS — M069 Rheumatoid arthritis, unspecified: Secondary | ICD-10-CM | POA: Diagnosis not present

## 2024-02-21 DIAGNOSIS — R9389 Abnormal findings on diagnostic imaging of other specified body structures: Secondary | ICD-10-CM | POA: Diagnosis not present

## 2024-02-21 NOTE — Patient Instructions (Signed)
 I have placed an order for pulmonary function test can be done anytime from now  A repeat CT scan of the chest will be done about a year from now, order is in  We will repeat the breathing study about a year from now as well  Call with any significant concerns most significantly cough, shortness of breath  I will see you a year from now

## 2024-02-21 NOTE — Progress Notes (Signed)
 CYLAH Hart    [id number]    05/15/1970  Primary Care Physician:Tisovec, Charlie ORN, MD  Referring Physician: Tisovec, Richard W, MD [address] [place],  [place] [address]  Chief complaint:   Patient being seen for an abnormal CT scan  Discussed the use of AI scribe software for clinical note transcription with the patient, who gave verbal consent to proceed.  History of Present Illness Samantha Hart is a 53 year old female with rheumatoid arthritis who presents for evaluation of lung findings on CT scan.  Pulmonary imaging abnormalities - Two months ago, a fall prompted a CT scan revealing graininess and haziness at the lung bases. - High-resolution CT scan confirmed these findings. - Previous CT scan in 2021 was normal. - High-resolution CT scan in 2020 initially showed graininess, but a subsequent scan was normal.  Respiratory symptoms and functional status - No shortness of breath, cough, or chest pain. - No recent fevers, chills, or other acute symptoms. - Breathing study result was 75%. - Regularly walks approximately three miles in her neighborhood.  Rheumatologic symptoms and medication history - Rheumatoid arthritis with ongoing joint pain. - Previously treated with leflunomide, which was discontinued. - Currently taking low dose naltrexone.  Weight changes - Intentional weight loss of a few pounds. - No unexpected weight loss.  Denies a cough,   Outpatient Encounter Medications as of 02/21/2024  Medication Sig   levothyroxine (SYNTHROID) 100 MCG tablet Take by mouth.   naltrexone (DEPADE) 50 MG tablet Take by mouth daily. 4mg  once daily   albuterol  (VENTOLIN  HFA) 108 (90 Base) MCG/ACT inhaler Inhale 2 puffs into the lungs every 6 (six) hours as needed for wheezing or shortness of breath. (Patient not taking: Reported on 02/21/2024)   clonazePAM (KLONOPIN) 0.5 MG tablet Take 0.5 mg by mouth 2 (two) times daily as needed for anxiety.  (Patient not taking: Reported on 02/21/2024)   fluticasone furoate-vilanterol (BREO ELLIPTA ) 100-25 MCG/INH AEPB Inhale 1 puff into the lungs daily. (Patient not taking: Reported on 02/21/2024)   fluticasone furoate-vilanterol (BREO ELLIPTA ) 100-25 MCG/INH AEPB Inhale 1 puff into the lungs daily. (Patient not taking: Reported on 02/21/2024)   hydroxychloroquine (PLAQUENIL) 200 MG tablet TAKE 2 TABLETS BY MOUTH WITH FOOD OR MILK DAILY (Patient not taking: Reported on 02/21/2024)   lidocaine  (LIDODERM ) 5 % Place 1 patch onto the skin daily. Remove & Discard patch within 12 hours or as directed by MD (Patient not taking: Reported on 02/21/2024)   methocarbamol  (ROBAXIN ) 500 MG tablet Take 1 tablet (500 mg total) by mouth every 8 (eight) hours as needed for muscle spasms. (Patient not taking: Reported on 02/21/2024)   methotrexate 2.5 MG tablet Take 2.5 mg by mouth once a week. 6 per week (Patient not taking: Reported on 02/21/2024)   oxyCODONE  (ROXICODONE ) 5 MG immediate release tablet Take 1 tablet (5 mg total) by mouth every 4 (four) hours as needed for severe pain. (Patient not taking: Reported on 02/21/2024)   No facility-administered encounter medications on file as of 02/21/2024.    Allergies as of 02/21/2024 - Review Complete 02/21/2024  Allergen Reaction Noted   Codeine Other (See Comments) 07/29/2013   Metronidazole  02/21/2024   Sulfacetamide sodium Other (See Comments) 10/03/2021   Sulfa antibiotics Rash 07/16/2015    Past Medical History:  Diagnosis Date   BCC (basal cell carcinoma) 09/25/2012   left lower lip- ([facility])   Fibromyalgia    Hypothyroidism  Past Surgical History:  Procedure Laterality Date   BACK SURGERY      Family History  Problem Relation Age of Onset   Lung cancer Father     Social History   Socioeconomic History   Marital status: Married    Spouse name: Not on file   Number of children: Not on file   Years of education: Not on file   Highest  education level: Not on file  Occupational History   Not on file  Tobacco Use   Smoking status: Never   Smokeless tobacco: Never  Substance and Sexual Activity   Alcohol use: Not Currently   Drug use: Not Currently   Sexual activity: Not on file  Other Topics Concern   Not on file  Social History Narrative   Not on file   Social Drivers of Health   Financial Resource Strain: Not on file  Food Insecurity: Not on file  Transportation Needs: Not on file  Physical Activity: Not on file  Stress: Not on file  Social Connections: Not on file  Intimate Partner Violence: Not on file    Review of Systems  Constitutional:  Negative for fever.  Respiratory:  Negative for cough and shortness of breath.   Musculoskeletal:  Positive for arthralgias.    Vitals:   02/21/24 1250  BP: 113/75  Pulse: 84  Temp: 97.9 F (36.6 C)  SpO2: 97%     Physical Exam Constitutional:      Appearance: Normal appearance.  HENT:     Head: Normocephalic.     Mouth/Throat:     Mouth: Mucous membranes are moist.  Eyes:     General: No scleral icterus. Cardiovascular:     Rate and Rhythm: Normal rate and regular rhythm.     Heart sounds: No murmur heard.    No friction rub.  Pulmonary:     Effort: No respiratory distress.     Breath sounds: No stridor. No wheezing or rhonchi.  Musculoskeletal:     Cervical back: No rigidity or tenderness.  Neurological:     Mental Status: She is alert.  Psychiatric:        Mood and Affect: Mood normal.    Data Reviewed: CT chest 12/20/2023 reviewed, this was compared with 05/09/2019 Results from high-resolution CT 10/24/2018 also reviewed  Pulmonary function test from 02/05/2019 reviewed showing normal findings  Reviewed notes by the rheumatologist-Dr. Aryal  Assessment and Plan Assessment & Plan Interstitial lung disease associated with connective tissue disease CT scan from two months ago shows grainy and hazy areas at the lung bases, consistent  with interstitial lung disease associated with connective tissue disease, potentially related to rheumatoid arthritis. She is currently asymptomatic with no cough, chest pain, or significant weight loss. Previous pulmonary function test showed a 75% capacity, slightly below the expected 80%, but relatively normal. - Order repeat pulmonary function test to establish current baseline. - Schedule repeat CT scan in one year to monitor for changes. - Consider antifibrotic therapy with Ofev or Esbriet if there is evidence of disease progression. Discussed that Ofev may cause diarrhea, nausea, vomiting, and stomach pain, while Esbriet may cause liver problems, photosensitivity, and gastrointestinal issues. Antifibrotics can reduce lung changes by up to 50% over the years.  Rheumatoid arthritis, mixed connective tissue disorder  The pulmonary function test is a way to keep an eye on the lung functions and this may need to be repeated yearly We can make a decision regarding how frequently to get  CT scans of the chest  You may call us  at any time if you have any significant respiratory complaints   Orders Placed This Encounter  Procedures   CT Chest High Resolution    Standing Status:   Future    Expected Date:   12/21/2024    Expiration Date:   03/21/2025    Is patient pregnant?:   No    Preferred imaging location?:   [facility]   Pulmonary Function Test    Standing Status:   Future    Expiration Date:   02/20/2025    Full PFT:   Yes    I spent 30 minutes dedicated to the care of this patient on the date of this encounter to include previsit review of records, face-to-face time with the patient discussing conditions above, post visit ordering of testing,ordering medications,independentlyinterpreting results, clinical documentation with electronic health record   Jennet Epley MD [facility] 02/21/2024, 1:09 PM  CC: Tisovec, Charlie ORN, MD

## 2024-03-11 ENCOUNTER — Encounter

## 2024-03-11 ENCOUNTER — Ambulatory Visit: Admitting: *Deleted

## 2024-03-11 DIAGNOSIS — R9389 Abnormal findings on diagnostic imaging of other specified body structures: Secondary | ICD-10-CM

## 2024-03-11 LAB — PULMONARY FUNCTION TEST
DL/VA % pred: 112 %
DL/VA: 4.87 ml/min/mmHg/L
DLCO cor % pred: 89 %
DLCO cor: 17.4 ml/min/mmHg
DLCO unc % pred: 89 %
DLCO unc: 17.4 ml/min/mmHg
FEF 25-75 Post: 2.91 L/s
FEF 25-75 Pre: 2.74 L/s
FEF2575-%Change-Post: 5 %
FEF2575-%Pred-Post: 114 %
FEF2575-%Pred-Pre: 108 %
FEV1-%Change-Post: 0 %
FEV1-%Pred-Post: 86 %
FEV1-%Pred-Pre: 86 %
FEV1-Post: 2.2 L
FEV1-Pre: 2.2 L
FEV1FVC-%Change-Post: 0 %
FEV1FVC-%Pred-Pre: 107 %
FEV6-%Change-Post: 0 %
FEV6-%Pred-Post: 81 %
FEV6-%Pred-Pre: 81 %
FEV6-Post: 2.56 L
FEV6-Pre: 2.57 L
FEV6FVC-%Pred-Post: 103 %
FEV6FVC-%Pred-Pre: 103 %
FVC-%Change-Post: 0 %
FVC-%Pred-Post: 78 %
FVC-%Pred-Pre: 79 %
FVC-Post: 2.56 L
FVC-Pre: 2.57 L
Post FEV1/FVC ratio: 86 %
Post FEV6/FVC ratio: 100 %
Pre FEV1/FVC ratio: 85 %
Pre FEV6/FVC Ratio: 100 %
RV % pred: 109 %
RV: 1.92 L
TLC % pred: 94 %
TLC: 4.48 L

## 2024-03-11 NOTE — Progress Notes (Signed)
 Full PFT performed today.

## 2024-03-11 NOTE — Patient Instructions (Signed)
 Full PFT performed today.
